# Patient Record
Sex: Female | Born: 1990 | Race: Asian | Hispanic: No | Smoking: Never smoker
Health system: Southern US, Community
[De-identification: ages and names within clinical notes are randomized; demographics above are authoritative.]

## PROBLEM LIST (undated history)

## (undated) ENCOUNTER — Ambulatory Visit: Source: Home / Self Care

## (undated) ENCOUNTER — Inpatient Hospital Stay (HOSPITAL_COMMUNITY): Payer: Self-pay

## (undated) DIAGNOSIS — T7840XA Allergy, unspecified, initial encounter: Secondary | ICD-10-CM

## (undated) DIAGNOSIS — A64 Unspecified sexually transmitted disease: Secondary | ICD-10-CM

## (undated) DIAGNOSIS — R32 Unspecified urinary incontinence: Secondary | ICD-10-CM

## (undated) HISTORY — PX: BREAST BIOPSY: SHX20

## (undated) HISTORY — PX: OTHER SURGICAL HISTORY: SHX169

## (undated) HISTORY — DX: Allergy, unspecified, initial encounter: T78.40XA

## (undated) HISTORY — DX: Unspecified urinary incontinence: R32

## (undated) HISTORY — DX: Unspecified sexually transmitted disease: A64

---

## 2014-01-28 DIAGNOSIS — A64 Unspecified sexually transmitted disease: Secondary | ICD-10-CM

## 2014-01-28 HISTORY — DX: Unspecified sexually transmitted disease: A64

## 2014-02-22 ENCOUNTER — Ambulatory Visit (INDEPENDENT_AMBULATORY_CARE_PROVIDER_SITE_OTHER): Payer: BC Managed Care – PPO | Admitting: Family Medicine

## 2014-02-22 VITALS — BP 110/86 | HR 73 | Temp 98.0°F | Resp 16 | Ht 64.5 in | Wt 167.0 lb

## 2014-02-22 DIAGNOSIS — Z0000 Encounter for general adult medical examination without abnormal findings: Secondary | ICD-10-CM

## 2014-02-22 DIAGNOSIS — Z111 Encounter for screening for respiratory tuberculosis: Secondary | ICD-10-CM

## 2014-02-22 DIAGNOSIS — Z23 Encounter for immunization: Secondary | ICD-10-CM

## 2014-02-22 NOTE — Patient Instructions (Signed)
We will let you know the results of your lipids in a few days.  If further medical problems developed we're happy to see him.

## 2014-02-22 NOTE — Progress Notes (Signed)
Physical Exam Patient is here for physical examination. She is going to be going to [employer] and needs a form completed. She brought in her shot record card with her. She's been very healthy  Past medical history: Operations: None Medical illnesses: None  regular medications: None  allergies: None Gravida 0 para 0  Family history: Parents and 3 siblings are all living and well. Her brother does have high cholesterol.  Social history: Patient works a job and is going to be going to college. She wants to become a Engineer, sitemedical assistant. Has one regular boyfriend. Sexual partner history.  Review of systems: Constitutional: Unremarkable HEENT: Unremarkable Respiratory: Unremarkable Cardiovascular: Unremarkable Genitourinary: Unremarkable Musculoskeletal: Unremarkable Neurologic: Unremarkable Psychiatric: Unremarkable Endocrinologic: Unremarkable  Physical exam: Well-developed well-nourished young lady in no major distress. TMs normal. Eyes PERRLA. Fundi benign. Throat clear. Neck supple without nodes thyromegaly. No carotid bruits. Chest clear to auscultation. Heart regular without murmurs gallops or arrhythmias. Breasts full without any masses. No axillary nodes. Abdomen soft without mass or tenderness. Pelvic normal external genitalia. A little excoriation on her left labia majora. She thinks it's from pantiliners. Pelvic is unremarkable. Cervix benign. Pap was taken. Bimanual exam reveals no uterine or or return masses. Extremities unremarkable. Skin unremarkable.  Assessment: Normal physical exam Mild STD risk  Plan: HIV, RPR, Pap 3, catch up hepatitis A and HPV vaccines, PPD, check lipids

## 2014-02-23 LAB — LIPID PANEL
Cholesterol: 186 mg/dL (ref 0–200)
HDL: 60 mg/dL (ref >39)
LDL Cholesterol: 101 mg/dL — ABNORMAL HIGH (ref 0–99)
Total CHOL/HDL Ratio: 3.1 Ratio
Triglycerides: 124 mg/dL (ref <150)
VLDL: 25 mg/dL (ref 0–40)

## 2014-02-23 LAB — RPR

## 2014-02-23 LAB — HIV ANTIBODY (ROUTINE TESTING W REFLEX): HIV: NONREACTIVE

## 2014-02-24 ENCOUNTER — Ambulatory Visit (INDEPENDENT_AMBULATORY_CARE_PROVIDER_SITE_OTHER): Payer: BC Managed Care – PPO | Admitting: Family Medicine

## 2014-02-24 DIAGNOSIS — Z23 Encounter for immunization: Secondary | ICD-10-CM

## 2014-02-24 DIAGNOSIS — Z111 Encounter for screening for respiratory tuberculosis: Secondary | ICD-10-CM

## 2014-02-24 DIAGNOSIS — Z283 Underimmunization status: Secondary | ICD-10-CM

## 2014-02-24 DIAGNOSIS — A749 Chlamydial infection, unspecified: Secondary | ICD-10-CM

## 2014-02-24 DIAGNOSIS — Z2839 Other underimmunization status: Secondary | ICD-10-CM

## 2014-02-24 LAB — TB SKIN TEST
Induration: 0 mm
TB Skin Test: NEGATIVE

## 2014-02-24 MED ORDER — AZITHROMYCIN 250 MG PO TABS: ORAL_TABLET | ORAL | Status: DC

## 2014-02-24 NOTE — Patient Instructions (Addendum)
Take azithromycin 4 pills together one dose for chlamydia  No sex until your contact has been treated and can prove he has.  Chlamydia Test This a test to see if you have Chlamydia which is a common sexually transmitted disease (STD). You get this disease by having sexual contact (oral, vaginal, or anal) with an infected person. An infected mother can spread the disease to her baby during childbirth. If this test is positive, you have a sexually transmitted disease (STD). DO NOT have unprotected sex until the treatment is complete and you have been retested and are negative. PREPARATION FOR TEST Your caregiver will use a swab to take a sample. The swab may be from the cervix, urethra, penis, anus, or throat. It may be possible to use a urine sample, if the lab where the sample is sent is able to test urine for this disease. NORMAL FINDINGS  No Growth  Antibodies: less than 1:640 Ranges for normal findings may vary among different laboratories and hospitals. You should always check with your doctor after having lab work or other tests done to discuss the meaning of your test results and whether your values are considered within normal limits. MEANING OF TEST  Your caregiver will go over the test results with you and discuss the importance and meaning of your results, as well as treatment options and the need for additional tests if necessary. OBTAINING THE TEST RESULTS It is your responsibility to obtain your test results. Ask the lab or department performing the test when and how you will get your results. Document Released: 12/09/2004 Document Revised: 02/08/2012 Document Reviewed: 10/25/2008 [facility] Patient Information 2014 [place].   Chlamydia, Female Chlamydia is an infection. It is spread through sexual contact. Chlamydia can be in different areas of the body. These areas include the cervix, urethra, throat, or rectum. You may not know you have chlamydia because many people never  develop the symptoms. Chlamydia is not difficult to treat once you know you have it. However, if it is left untreated, chlamydia can lead to more serious health problems.  CAUSES  Chlamydia is caused by bacteria. It is a sexually transmitted disease. It is passed from an infected partner during intimate contact. This contact could be with the genitals, mouth, or rectal area. Chlamydia can also be passed from mothers to babies during birth. SIGNS AND SYMPTOMS  There may not be any symptoms. This is often the case early in the infection. If symptoms develop, they may include:  Mild pain and discomfort when urinating.  Redness, soreness, and swelling (inflammation) of the rectum.  Vaginal discharge.  Painful intercourse.  Abdominal pain.  Bleeding between menstrual periods. DIAGNOSIS  To diagnose this infection, your health care provider will do a pelvic exam. Cultures will be taken of the vagina, cervix, urine, and possibly the rectum to verify the diagnosis.  TREATMENT You will be given antibiotic medicines. If you are pregnant, certain types of antibiotics will need to be avoided. Any sexual partners should also be treated, even if they do not show symptoms.  HOME CARE INSTRUCTIONS   Take your antibiotics as directed. Make sure you finish them even if you start to feel better.  Only take over-the-counter or prescription medicines for pain, discomfort, or fever as directed by your health care provider.  Inform any sexual partners about the infection. They should also be treated.  Do not have sexual contact until your health care provider tells you it is okay.  Get plenty of  rest.  Eat a well-balanced diet.  Drink enough fluids to keep your urine clear or pale yellow.  Follow up with your health care provider as directed. SEEK MEDICAL CARE IF:  You have painful urination.  You have abdominal pain.  You have vaginal discharge.  You have painful sexual intercourse.  You  are a woman and have bleeding between periods and after sex. SEEK IMMEDIATE MEDICAL CARE IF:   You have a fever or persistent symptoms for more than 2 3 days.  You have a fever and your symptoms suddenly get worse.  You experience nausea or vomiting.  You experience excessive sweating (diaphoresis).  You have difficulty swallowing. MAKE SURE YOU:   Understand these instructions.  Will watch your condition.  Will get help right away if you are not doing well or get worse. Document Released: 08/26/2005 Document Revised: 09/06/2013 Document Reviewed: 07/24/2013 [facility] Patient Information 2014 [place], [place].

## 2014-02-24 NOTE — Progress Notes (Signed)
   Subjective:    Patient ID: Misty Kelly, female    DOB: 1991/09/19, 23 y.o.   MRN: [id number]  HPI Pt here for a tb read, tb negative with 0 induration.    Review of Systems     Objective:   Physical Exam        Assessment & Plan:

## 2014-02-24 NOTE — Progress Notes (Signed)
Subjective: Patient was here for her TB skin test and they looked at the reports of her labs and to come back today. The lab tests from the other day came back showing the patient has Chlamydia. She is only when been with her one boyfriend ever in her life. She showed me something else on her form. I had noticed that she needed a meningococcal vaccine still. She would like to go ahead and get it.  Objective: Not examined. Long discussion  Assessment: Chlamydia Immunization deficiency  Plan: Azithromycin 1 g by mouth  This is a little confusing. I had already written her instructions on the PPD visit, so printed those and gave them to her. Please refer to them.

## 2014-02-26 LAB — PAP IG, CT-NG, RFX HPV ASCU
Chlamydia Probe Amp: POSITIVE — AB
GC Probe Amp: NEGATIVE

## 2014-02-27 LAB — HUMAN PAPILLOMAVIRUS, HIGH RISK: HPV DNA High Risk: NOT DETECTED

## 2015-03-09 ENCOUNTER — Ambulatory Visit (INDEPENDENT_AMBULATORY_CARE_PROVIDER_SITE_OTHER): Payer: BLUE CROSS/BLUE SHIELD | Admitting: Physician Assistant

## 2015-03-09 VITALS — BP 128/76 | HR 90 | Temp 97.9°F | Resp 16 | Ht 64.0 in | Wt 176.8 lb

## 2015-03-09 DIAGNOSIS — J309 Allergic rhinitis, unspecified: Secondary | ICD-10-CM

## 2015-03-09 MED ORDER — MONTELUKAST SODIUM 10 MG PO TABS: 10.0000 mg | ORAL_TABLET | Freq: Every day | ORAL | Status: DC

## 2015-03-09 NOTE — Patient Instructions (Signed)
Take claritin daily (take regular claritin at night OR take claritin-D in the morning) Use flonase every morning. Take singulair at night. Drink plenty of water. Call me in 2 weeks if you are not doing any better and I will refer you to an allergist.

## 2015-03-09 NOTE — Progress Notes (Signed)
Subjective:    Patient ID: Misty Kelly, female    DOB: 29-Apr-1991, 24 y.o.   MRN: [id number]  HPI  This is a 24 year old female who is presenting with allergic symptoms x 3 weeks. Symptoms include sneezing, coughing and nasal congestion. Symptoms are worse at night. She states she coughs to "clear an itch in her throat".  Reports last time her allergies were this bad was 3 years ago. Claritin usually works well for her although it has not worked as well this time. She has tried several things over the past few weeks, each for only a few doses and then moving to another OTC medication when the prior med did not seem to be working. She has tried benadryl, claritin, afrin, flonase and mucinex - each without much relief. She denies fever or chills.  Review of Systems  Constitutional: Negative for fever and chills.  HENT: Positive for congestion and sneezing. Negative for ear pain, sinus pressure and sore throat.   Respiratory: Positive for cough. Negative for shortness of breath and wheezing.   Gastrointestinal: Negative for nausea, vomiting and abdominal pain.  Skin: Negative for rash.  Allergic/Immunologic: Positive for environmental allergies.  Hematological: Negative for adenopathy.  Psychiatric/Behavioral: Positive for sleep disturbance (d/t congestion).    There are no active problems to display for this patient.  Prior to Admission medications   Medication Sig Start Date End Date Taking? Authorizing Provider  diphenhydrAMINE (BENADRYL) 25 mg capsule Take 25 mg by mouth every 6 (six) hours as needed.   Yes Historical Provider, MD  fluticasone (FLONASE) 50 MCG/ACT nasal spray Place into both nostrils daily.   Yes Historical Provider, MD  loratadine (CLARITIN) 10 MG tablet Take 10 mg by mouth daily.   Yes Historical Provider, MD  naproxen sodium (ANAPROX) 220 MG tablet Take 220 mg by mouth 2 (two) times daily with a meal.   Yes Historical Provider, MD   No Known  Allergies  Patient's social and family history were reviewed.     Objective:   Physical Exam  Constitutional: She is oriented to person, place, and time. She appears well-developed and well-nourished. No distress.  HENT:  Head: Normocephalic and atraumatic.  Right Ear: Hearing, external ear and ear canal normal. Tympanic membrane is retracted.  Left Ear: Hearing, external ear and ear canal normal. Tympanic membrane is retracted.  Nose: Right sinus exhibits no maxillary sinus tenderness and no frontal sinus tenderness. Left sinus exhibits no maxillary sinus tenderness and no frontal sinus tenderness.  Mouth/Throat: Uvula is midline and mucous membranes are normal. Posterior oropharyngeal erythema present. No oropharyngeal exudate or posterior oropharyngeal edema.  Pale nasal mucosa  Eyes: Conjunctivae and lids are normal. Right eye exhibits no discharge. Left eye exhibits no discharge. No scleral icterus.  Cardiovascular: Normal rate, regular rhythm, normal heart sounds and normal pulses.   No murmur heard. Pulmonary/Chest: Effort normal and breath sounds normal. No respiratory distress. She has no wheezes. She has no rhonchi. She has no rales.  Musculoskeletal: Normal range of motion.  Lymphadenopathy:       Head (right side): No submental, no submandibular and no tonsillar adenopathy present.       Head (left side): No submental, no submandibular and no tonsillar adenopathy present.    She has no cervical adenopathy.  Neurological: She is alert and oriented to person, place, and time.  Skin: Skin is warm, dry and intact. No lesion and no rash noted.  Psychiatric: She has a normal mood  and affect. Her speech is normal and behavior is normal. Thought content normal.   BP 128/76 mmHg  Pulse 90  Temp(Src) 97.9 F (36.6 C) (Oral)  Resp 16  Ht 5\' 4"  (1.626 m)  Wt 176 lb 12.8 oz (80.196 kg)  BMI 30.33 kg/m2  SpO2 99%  LMP 02/11/2015     Assessment & Plan:  1. Allergic rhinitis,  unspecified allergic rhinitis type Pt will consistently take claritin, flonase and singulair daily for next 2 weeks. She will not take any other OTC medication for allergies. She will call me in 2 weeks if her symptoms are not improving and will send her to an allergist for further evaluation.   - montelukast (SINGULAIR) 10 MG tablet; Take 1 tablet (10 mg total) by mouth at bedtime.  Dispense: 30 tablet; Refill: 3   Murial Beam V. Dyke BrackettBush, PA-C, [facility]  03/09/2015

## 2015-05-21 ENCOUNTER — Emergency Department (HOSPITAL_COMMUNITY)
Admission: EM | Admit: 2015-05-21 | Discharge: 2015-05-21 | Disposition: A | Discharge status: Home / Self Care | Payer: BLUE CROSS/BLUE SHIELD | Source: Home / Self Care | Attending: Family Medicine | Admitting: Family Medicine

## 2015-05-21 ENCOUNTER — Emergency Department (INDEPENDENT_AMBULATORY_CARE_PROVIDER_SITE_OTHER): Payer: BLUE CROSS/BLUE SHIELD

## 2015-05-21 ENCOUNTER — Encounter (HOSPITAL_COMMUNITY): Payer: Self-pay | Admitting: Emergency Medicine

## 2015-05-21 DIAGNOSIS — J9801 Acute bronchospasm: Secondary | ICD-10-CM

## 2015-05-21 DIAGNOSIS — R0982 Postnasal drip: Secondary | ICD-10-CM

## 2015-05-21 DIAGNOSIS — J302 Other seasonal allergic rhinitis: Secondary | ICD-10-CM

## 2015-05-21 LAB — POCT URINALYSIS DIP (DEVICE)
Bilirubin Urine: NEGATIVE
Glucose, UA: NEGATIVE mg/dL
Hgb urine dipstick: NEGATIVE
Ketones, ur: NEGATIVE mg/dL
Leukocytes, UA: NEGATIVE
Nitrite: NEGATIVE
Protein, ur: NEGATIVE mg/dL
Specific Gravity, Urine: 1.02 (ref 1.005–1.030)
Urobilinogen, UA: 1 mg/dL (ref 0.0–1.0)
pH: 7.5 (ref 5.0–8.0)

## 2015-05-21 LAB — POCT PREGNANCY, URINE: Preg Test, Ur: NEGATIVE

## 2015-05-21 MED ORDER — PREDNISONE 20 MG PO TABS: ORAL_TABLET | ORAL | Status: DC

## 2015-05-21 MED ORDER — ALBUTEROL SULFATE HFA 108 (90 BASE) MCG/ACT IN AERS: 2.0000 | INHALATION_SPRAY | RESPIRATORY_TRACT | Status: DC | PRN

## 2015-05-21 NOTE — Discharge Instructions (Signed)
Allergic Rhinitis Continue Singulair every night Start Zyrtec or Allegra 180 mg a day Allergic rhinitis is when the mucous membranes in the nose respond to allergens. Allergens are particles in the air that cause your body to have an allergic reaction. This causes you to release allergic antibodies. Through a chain of events, these eventually cause you to release histamine into the blood stream. Although meant to protect the body, it is this release of histamine that causes your discomfort, such as frequent sneezing, congestion, and an itchy, runny nose.  CAUSES  Seasonal allergic rhinitis (hay fever) is caused by pollen allergens that may come from grasses, trees, and weeds. Year-round allergic rhinitis (perennial allergic rhinitis) is caused by allergens such as house dust mites, pet dander, and mold spores.  SYMPTOMS  1. Nasal stuffiness (congestion). 2. Itchy, runny nose with sneezing and tearing of the eyes. DIAGNOSIS  Your health care provider can help you determine the allergen or allergens that trigger your symptoms. If you and your health care provider are unable to determine the allergen, skin or blood testing may be used. TREATMENT  Allergic rhinitis does not have a cure, but it can be controlled by:  Medicines and allergy shots (immunotherapy).  Avoiding the allergen. Hay fever may often be treated with antihistamines in pill or nasal spray forms. Antihistamines block the effects of histamine. There are over-the-counter medicines that may help with nasal congestion and swelling around the eyes. Check with your health care provider before taking or giving this medicine.  If avoiding the allergen or the medicine prescribed do not work, there are many new medicines your health care provider can prescribe. Stronger medicine may be used if initial measures are ineffective. Desensitizing injections can be used if medicine and avoidance does not work. Desensitization is when a patient is given  ongoing shots until the body becomes less sensitive to the allergen. Make sure you follow up with your health care provider if problems continue. HOME CARE INSTRUCTIONS It is not possible to completely avoid allergens, but you can reduce your symptoms by taking steps to limit your exposure to them. It helps to know exactly what you are allergic to so that you can avoid your specific triggers. SEEK MEDICAL CARE IF:   You have a fever.  You develop a cough that does not stop easily (persistent).  You have shortness of breath.  You start wheezing.  Symptoms interfere with normal daily activities. Document Released: 08/11/2001 Document Revised: 11/21/2013 Document Reviewed: 07/24/2013 [facility] Patient Information 2015 [place], [place]. This information is not intended to replace advice given to you by your health care provider. Make sure you discuss any questions you have with your health care provider.  Bronchospasm A bronchospasm is when the tubes that carry air in and out of your lungs (airways) spasm or tighten. During a bronchospasm it is hard to breathe. This is because the airways get smaller. A bronchospasm can be triggered by: 3. Allergies. These may be to animals, pollen, food, or mold. 4. Infection. This is a common cause of bronchospasm. 5. Exercise. 6. Irritants. These include pollution, cigarette smoke, strong odors, aerosol sprays, and paint fumes. 7. Weather changes. 8. Stress. 9. Being emotional. HOME CARE   Always have a plan for getting help. Know when to call your doctor and local emergency services (911 in the [place] Know where you can get emergency care.  Only take medicines as told by your doctor.  If you were prescribed an inhaler or nebulizer machine,  ask your doctor how to use it correctly. Always use a spacer with your inhaler if you were given one.  Stay calm during an attack. Try to relax and breathe more slowly.  Control your home environment:  Change  your heating and air conditioning filter at least once a month.  Limit your use of fireplaces and wood stoves.  Do not  smoke. Do not  allow smoking in your home.  Avoid perfumes and fragrances.  Get rid of pests (such as roaches and mice) and their droppings.  Throw away plants if you see mold on them.  Keep your house clean and dust free.  Replace carpet with wood, tile, or vinyl flooring. Carpet can trap dander and dust.  Use allergy-proof pillows, mattress covers, and box spring covers.  Wash bed sheets and blankets every week in hot water. Dry them in a dryer.  Use blankets that are made of polyester or cotton.  Wash hands frequently. GET HELP IF:  You have muscle aches.  You have chest pain.  The thick spit you spit or cough up (sputum) changes from clear or white to yellow, green, gray, or bloody.  The thick spit you spit or cough up gets thicker.  There are problems that may be related to the medicine you are given such as:  A rash.  Itching.  Swelling.  Trouble breathing. GET HELP RIGHT AWAY IF:  You feel you cannot breathe or catch your breath.  You cannot stop coughing.  Your treatment is not helping you breathe better.  You have very bad chest pain. MAKE SURE YOU:   Understand these instructions.  Will watch your condition.  Will get help right away if you are not doing well or get worse. Document Released: 09/13/2009 Document Revised: 11/21/2013 Document Reviewed: 05/09/2013 [facility] Patient Information 2015 [place], [place]. This information is not intended to replace advice given to you by your health care provider. Make sure you discuss any questions you have with your health care provider.  How to Use an Inhaler Using your inhaler correctly is very important. Good technique will make sure that the medicine reaches your lungs.  HOW TO USE AN INHALER: 10. Take the cap off the inhaler. 11. If this is the first time using your inhaler, you  need to prime it. Shake the inhaler for 5 seconds. Release four puffs into the air, away from your face. Ask your doctor for help if you have questions. 12. Shake the inhaler for 5 seconds. 13. Turn the inhaler so the bottle is above the mouthpiece. 14. Put your pointer finger on top of the bottle. Your thumb holds the bottom of the inhaler. 15. Open your mouth. 16. Either hold the inhaler away from your mouth (the width of 2 fingers) or place your lips tightly around the mouthpiece. Ask your doctor which way to use your inhaler. 17. Breathe out as much air as possible. 18. Breathe in and push down on the bottle 1 time to release the medicine. You will feel the medicine go in your mouth and throat. 19. Continue to take a deep breath in very slowly. Try to fill your lungs. 20. After you have breathed in completely, hold your breath for 10 seconds. This will help the medicine to settle in your lungs. If you cannot hold your breath for 10 seconds, hold it for as long as you can before you breathe out. 21. Breathe out slowly, through pursed lips. Whistling is an example of pursed lips.  22. If your doctor has told you to take more than 1 puff, wait at least 15-30 seconds between puffs. This will help you get the best results from your medicine. Do not use the inhaler more than your doctor tells you to. 23. Put the cap back on the inhaler. 24. Follow the directions from your doctor or from the inhaler package about cleaning the inhaler. If you use more than one inhaler, ask your doctor which inhalers to use and what order to use them in. Ask your doctor to help you figure out when you will need to refill your inhaler.  If you use a steroid inhaler, always rinse your mouth with water after your last puff, gargle and spit out the water. Do not swallow the water. GET HELP IF:  The inhaler medicine only partially helps to stop wheezing or shortness of breath.  You are having trouble using your  inhaler.  You have some increase in thick spit (phlegm). GET HELP RIGHT AWAY IF:  The inhaler medicine does not help your wheezing or shortness of breath or you have tightness in your chest.  You have dizziness, headaches, or fast heart rate.  You have chills, fever, or night sweats.  You have a large increase of thick spit, or your thick spit is bloody. MAKE SURE YOU:   Understand these instructions.  Will watch your condition.  Will get help right away if you are not doing well or get worse. Document Released: 08/25/2008 Document Revised: 09/06/2013 Document Reviewed: 06/15/2013 [facility] Patient Information 2015 [place], [place]. This information is not intended to replace advice given to you by your health care provider. Make sure you discuss any questions you have with your health care provider.

## 2015-05-21 NOTE — ED Provider Notes (Signed)
CSN: [id number]     Arrival date & time 05/21/15  1448 History   First MD Initiated Contact with Patient 05/21/15 1615     Chief Complaint  Patient presents with  . Cough  . Vaginal Discharge   (Consider location/radiation/quality/duration/timing/severity/associated sxs/prior Treatment) HPI Comments: 24 year old female who has had a cough for several years. Tends to become worse during allergy season. She denies allergy symptoms at this time. Denies PND, nasal discharge, red or watery eyes. Denies shortness of breath. The patient had been to another urgent care and prescribed Flonase, Claritin and Singulair. She thought that they did not work very well or she believed that her allergies have been cured at least for times that she had stopped taking her medications.  Her second complaint is a bit more confusing she is complaining of either vaginal discharge or urination when she coughs. She is not sure whether she is urinating when she coughs or she has a vaginal discharge. Either way this is been occurring for about 3 years.  Patient is a 24 y.o. female presenting with cough and vaginal discharge.  Cough Associated symptoms: no ear pain, no fever, no rhinorrhea, no shortness of breath and no sore throat   Vaginal Discharge Associated symptoms: no fever     Past Medical History  Diagnosis Date  . Allergy    History reviewed. No pertinent past surgical history. Family History  Problem Relation Age of Onset  . Hyperlipidemia Brother    History  Substance Use Topics  . Smoking status: Never Smoker   . Smokeless tobacco: Not on file  . Alcohol Use: No   OB History    No data available     Review of Systems  Constitutional: Negative for fever, activity change and fatigue.  HENT: Negative for congestion, ear pain, postnasal drip, rhinorrhea, sneezing and sore throat.   Eyes: Negative.   Respiratory: Positive for cough. Negative for shortness of breath.   Genitourinary:       As  per history of present illness.  Musculoskeletal: Negative.   Skin: Negative.   Neurological: Negative.     Allergies  Review of patient's allergies indicates no known allergies.  Home Medications   Prior to Admission medications   Medication Sig Start Date End Date Taking? Authorizing Provider  albuterol (PROVENTIL HFA;VENTOLIN HFA) 108 (90 BASE) MCG/ACT inhaler Inhale 2 puffs into the lungs every 4 (four) hours as needed for wheezing or shortness of breath. 05/21/15   Hayden Rasmussen, NP  diphenhydrAMINE (BENADRYL) 25 mg capsule Take 25 mg by mouth every 6 (six) hours as needed.    Historical Provider, MD  fluticasone (FLONASE) 50 MCG/ACT nasal spray Place into both nostrils daily.    Historical Provider, MD  loratadine (CLARITIN) 10 MG tablet Take 10 mg by mouth daily.    Historical Provider, MD  montelukast (SINGULAIR) 10 MG tablet Take 1 tablet (10 mg total) by mouth at bedtime. 03/09/15   Dorna Leitz, PA-C  naproxen sodium (ANAPROX) 220 MG tablet Take 220 mg by mouth 2 (two) times daily with a meal.    Historical Provider, MD  predniSONE (DELTASONE) 20 MG tablet Take 3 tabs po on first day, 2 tabs second day, 2 tabs third day, 1 tab fourth day, 1 tab 5th day. Take with food. 05/21/15   Hayden Rasmussen, NP   BP 128/86 mmHg  Pulse 88  Temp(Src) 98.7 F (37.1 C) (Oral)  Resp 12  SpO2 97%  LMP 04/23/2015 Physical Exam  Constitutional: She is oriented to person, place, and time. She appears well-developed and well-nourished. No distress.  HENT:  Bilateral TMs are normal Oropharynx with minor erythema, moderate cobblestoning. No exudates.  Eyes: EOM are normal.  Neck: Normal range of motion. Neck supple.  Cardiovascular: Normal rate, regular rhythm and normal heart sounds.   Pulmonary/Chest: Effort normal and breath sounds normal. No respiratory distress.  Lymphadenopathy:    She has no cervical adenopathy.  Neurological: She is alert and oriented to person, place, and time. She exhibits  normal muscle tone.  Skin: Skin is warm and dry.  Nursing note and vitals reviewed.   ED Course  Procedures (including critical care time) Labs Review Labs Reviewed  POCT URINALYSIS DIP (DEVICE)  POCT PREGNANCY, URINE    Imaging Review Dg Chest 2 View  05/21/2015   CLINICAL DATA:  Cough for 6 months  EXAM: CHEST  2 VIEW  COMPARISON:  None.  FINDINGS: Normal mediastinum and cardiac silhouette. Costophrenic angles are clear. No effusion, infiltrate, or pneumothorax.  IMPRESSION: No acute cardiopulmonary process.  Normal chest radiograph.   Electronically Signed   By: Genevive Bi M.D.   On: 05/21/2015 17:01     MDM   1. PND (post-nasal drip)   2. Cough due to bronchospasm   3. Other seasonal allergic rhinitis    patient notes that she believes that this either vaginal discharge or urine leak is due to her cough. She   declines to have a pelvic examination. For any concerns regarding vaginal or urine problems she prefers to see her gynecologist. Continue Singulair every night Start Zyrtec or Allegra 180 mg a day Albuterol HFA as dir Prednisone taper Obtain PCP for F/U or return if needed    Hayden Rasmussen, NP 05/21/15 1724  Hayden Rasmussen, NP 05/21/15 1725

## 2015-05-21 NOTE — ED Notes (Signed)
Pt states that when she coughs she has a discharge with the cough and states that this issue has been going on for at least 3 years.

## 2015-10-07 ENCOUNTER — Ambulatory Visit (INDEPENDENT_AMBULATORY_CARE_PROVIDER_SITE_OTHER): Payer: BLUE CROSS/BLUE SHIELD | Admitting: Obstetrics and Gynecology

## 2015-10-07 ENCOUNTER — Encounter: Payer: Self-pay | Admitting: Obstetrics and Gynecology

## 2015-10-07 VITALS — BP 130/78 | HR 86 | Resp 16 | Ht 64.5 in | Wt 174.4 lb

## 2015-10-07 DIAGNOSIS — R350 Frequency of micturition: Secondary | ICD-10-CM | POA: Diagnosis not present

## 2015-10-07 DIAGNOSIS — Z113 Encounter for screening for infections with a predominantly sexual mode of transmission: Secondary | ICD-10-CM

## 2015-10-07 DIAGNOSIS — M25559 Pain in unspecified hip: Secondary | ICD-10-CM | POA: Diagnosis not present

## 2015-10-07 LAB — POCT URINALYSIS DIPSTICK
Bilirubin, UA: NEGATIVE
Blood, UA: NEGATIVE
Glucose, UA: NEGATIVE
Ketones, UA: NEGATIVE
Leukocytes, UA: NEGATIVE
Nitrite, UA: NEGATIVE
Protein, UA: NEGATIVE
Urobilinogen, UA: NEGATIVE
pH, UA: 5

## 2015-10-07 LAB — POCT URINE PREGNANCY: Preg Test, Ur: NEGATIVE

## 2015-10-07 LAB — HEPATITIS C ANTIBODY: HCV Ab: NEGATIVE

## 2015-10-07 MED ORDER — IBUPROFEN 800 MG PO TABS: 800.0000 mg | ORAL_TABLET | Freq: Three times a day (TID) | ORAL | Status: DC | PRN

## 2015-10-07 NOTE — Progress Notes (Signed)
Patient ID: Misty Kelly, female   DOB: [birth year], 24 y.o.   MRN: [id number] 24 y.o. G0P0000 Single [place] female here for lower mid pelvic pain off and on for 5 months.  Patient also complains or urinary frequency.  Symptoms began after taking plan B. Feels throbbing inside and a throbbing, excruciating, lasting for 5 - 10 minutes.  Comes and goes.  Feels like the pain is up to a "10." The throbbing pain is different from lower abdominal pain she is also experiencing.  Laying down makes it better.  Movement make both pains worse.  Coughing also hurts. No pain medication use.  Had an episode of pain with intercourse also.  No association to fever, nausea, vomiting, diarrhea, constipation, or voiding.   Menses are regular and "normal." Heavy first 2 days with pad change every 2 - 3 hours.  Not painful.  Has history of chlamydia in 02/22/14, which was tested.  No retesting in record from [facility].  (Patient thought she had this done. )  Has urinary incontinence with coughing or lifting her nephew. Occurring since 2014 or 2015.  Voiding "all the time."  DF every 3 hours.  NF 1 -2 times per night.  No dysuria or hematuria.  No history of stones or UTIs. No prior bladder procedures or pelvic surgery.  Drinks sweet teas - 3 times per day at times. Coffee once per week and sodas once per week.  ETOH with special occasions.   Works a a [employer]vertising account planner.   UPT negative.   PCP:  None   Patient's last menstrual period was 09/19/2015 (exact date).          Sexually active: Yes.  female  The current method of family planning is condoms most of the time.    Exercising: No.   Smoker:  no  Health Maintenance: Pap:  03/2015 normal per patient History of abnormal Pap:  Yes.  02/22/14 had ASCUS and negative HR HPV.  Also had chlamydia at this time. MMG:  n/a Colonoscopy:  n/a BMD:   n/a  Result  n/a TDaP:  Within last 10 years Screening Labs:    Urine today: Neg   reports  that she has never smoked. She does not have any smokeless tobacco history on file. She reports that she drinks alcohol. She reports that she does not use illicit drugs.  Past Medical History  Diagnosis Date  . Allergy   . STD (sexually transmitted disease) 03/2015    Tx'd for Chlamydia--did have neg. test of cure  . Urinary incontinence     History reviewed. No pertinent past surgical history.  Current Outpatient Prescriptions  Medication Sig Dispense Refill  . ibuprofen (ADVIL,MOTRIN) 800 MG tablet Take 800 mg by mouth every 8 (eight) hours as needed.     No current facility-administered medications for this visit.    Family History  Problem Relation Age of Onset  . Hyperlipidemia Brother   . Hypertension Brother   . Cancer Maternal Grandfather     Lung Ca  . Stroke Paternal Grandfather   . Cancer Maternal Grandmother 15     Dec Esophogeal Ca  . Hypertension Mother     ROS:  Pertinent items are noted in HPI.  Otherwise, a comprehensive ROS was negative.  Exam:   BP 130/78 mmHg  Pulse 86  Resp 16  Ht 5' 4.5" (1.638 m)  Wt 174 lb 6.4 oz (79.107 kg)  BMI 29.48 kg/m2  LMP 09/19/2015 (Exact Date)  General appearance: alert, cooperative and appears stated age Head: Normocephalic, without obvious abnormality, atraumatic Neck: no adenopathy, supple, symmetrical, trachea midline and thyroid normal to inspection and palpation Lungs: clear to auscultation bilaterally Heart: regular rate and rhythm Abdomen: soft, mildly tender to palpation of bilateral lower abdomen with some guarding and no rebound.; bowel sounds normal; no masses,  no organomegaly Extremities: extremities normal, atraumatic, no cyanosis or edema Skin: Skin color, texture, turgor normal. No rashes or lesions Lymph nodes: Cervical, supraclavicular, and axillary nodes normal. No abnormal inguinal nodes palpated Neurologic: Grossly normal  Pelvic: External genitalia:  no lesions              Urethra:   normal appearing urethra with no masses, tenderness or lesions              Bartholins and Skenes: normal                 Vagina: normal appearing vagina with normal color and discharge, no lesions              Cervix: no lesions and no CMT.            Bimanual Exam:  Uterus:  normal size, contour, position, consistency, mobility, non-tender              Adnexa:  No mass in either adnexa.  Right adnexal tenderness and no left adnexal tenderness.               Rectovaginal: Yes.  .  Confirms.              Anus:  normal sphincter tone, no lesions  Chaperone was present for exam.  Assessment:     Pelvic pain. Status post tx of chlamydia with no follow up testing.  No acute abdomen. No clear PID. Stress incontinence.  Urinary frequency.   Plan:   Discussion of pelvic pain and possible etiologies - GYN, GI, urinary tract. STD testing performed. Follow up for pelvic ultrasound.  Motrin 800 mg po q 8 hours prn. Discussed reduction of bladder irritants to reduce frequency.  May be good candidate for pelvic floor therapy.  After visit summary provided.   ___30___ minutes face to face time of which over 50% was spent in counseling.

## 2015-10-08 ENCOUNTER — Telehealth: Payer: Self-pay | Admitting: Obstetrics and Gynecology

## 2015-10-08 LAB — WET PREP BY MOLECULAR PROBE
Candida species: NEGATIVE
Gardnerella vaginalis: NEGATIVE
Trichomonas vaginosis: NEGATIVE

## 2015-10-08 LAB — STD PANEL
HIV 1&2 Ab, 4th Generation: NONREACTIVE
Hepatitis B Surface Ag: NEGATIVE

## 2015-10-08 NOTE — Telephone Encounter (Signed)
Called patient to review benefits for procedure. Left voicemail to call back and review. °

## 2015-10-08 NOTE — Telephone Encounter (Signed)
Patient is returning a call to Becky. °

## 2015-10-09 LAB — IPS N GONORRHOEA AND CHLAMYDIA BY PCR

## 2015-10-09 NOTE — Telephone Encounter (Signed)
Returned call to patient to review benefits for procedure. Left voicemail to call back and review.

## 2015-10-10 ENCOUNTER — Ambulatory Visit (INDEPENDENT_AMBULATORY_CARE_PROVIDER_SITE_OTHER): Payer: BLUE CROSS/BLUE SHIELD | Admitting: Obstetrics and Gynecology

## 2015-10-10 ENCOUNTER — Ambulatory Visit (INDEPENDENT_AMBULATORY_CARE_PROVIDER_SITE_OTHER): Payer: BLUE CROSS/BLUE SHIELD

## 2015-10-10 ENCOUNTER — Encounter: Payer: Self-pay | Admitting: Obstetrics and Gynecology

## 2015-10-10 VITALS — BP 108/66 | HR 88 | Ht 64.5 in | Wt 174.0 lb

## 2015-10-10 DIAGNOSIS — N393 Stress incontinence (female) (male): Secondary | ICD-10-CM | POA: Diagnosis not present

## 2015-10-10 DIAGNOSIS — M25559 Pain in unspecified hip: Secondary | ICD-10-CM

## 2015-10-10 MED ORDER — LEVONORGEST-ETH ESTRAD 91-DAY 0.15-0.03 MG PO TABS: 1.0000 | ORAL_TABLET | Freq: Every day | ORAL | Status: DC

## 2015-10-10 NOTE — Progress Notes (Signed)
Subjective  24 y.o. 580P0000 female here for pelvic ultrasound for  Pelvic pain.  Hx chlamydia.  Also has urinary incontinence.   LMP was 09/19/15. Prior LMP ended on 08/17/15. Menses are regular and does not skip cycles.  Uses a lot of Advil.   Has leakage of urine with lifting.  Has chronic cough.  Not using inhalers.  Objective  Pelvic ultrasound images and report reviewed with patient.  Uterus - no masses EMS - 13.93 mm. Ovaries - left CL cyst 15 mm.  Multiple follicles bilaterally.  Free fluid - yes, small amount around left ovary.       STD testing from 10/07/15 - all negative.   Assessment  Pelvic pain.  Unremarkable pelvic ultrasound.  Need for contraception. Stress incontinence.  Chronic cough.   Plan  Discussion of pelvic pain.  Reassured regarding pelvic anatomy.  Discussion of options for contraception that also can treat pelvic pain.  Will treat [place].  Instructed in use.  Discussed potential side effects of thromboembolic events and warning signs. Encourage condom use. Patient will return to care of PCP to get cough under control and get refills of inhalers.  Reduction of cough will help with stress incontinence. Declines referral to PT. Follow up in 3 months.   _15______ minutes face to face time of which over 50% was spent in counseling.   After visit summary to patient.

## 2015-10-10 NOTE — Patient Instructions (Signed)
Ethinyl Estradiol; Levonorgestrel tablets What is this medicine? ETHINYL ESTRADIOL; LEVONORGESTREL (ETH in il es tra DYE ole; LEE voh nor jes trel) is an oral contraceptive. It combines two types of female hormones, an estrogen and a progestin. They are used to prevent ovulation and pregnancy. This medicine may be used for other purposes; ask your health care provider or pharmacist if you have questions. What should I tell my health care provider before I take this medicine? They need to know if you have or ever had any of these conditions: -abnormal vaginal bleeding -blood vessel disease or blood clots -breast, cervical, endometrial, ovarian, liver, or uterine cancer -diabetes -gallbladder disease -heart disease or recent heart attack -high blood pressure -high cholesterol -kidney disease -liver disease -migraine headaches -stroke -systemic lupus erythematosus (SLE) -tobacco smoker -an unusual or allergic reaction to estrogens, progestins, other medicines, foods, dyes, or preservatives -pregnant or trying to get pregnant -breast-feeding How should I use this medicine? Take this medicine by mouth. To reduce nausea, this medicine may be taken with food. Follow the directions on the prescription label. Take this medicine at the same time each day and in the order directed on the package. Do not take your medicine more often than directed. Contact your pediatrician regarding the use of this medicine in children. Special care may be needed. This medicine has been used in female children who have started having menstrual periods. A patient package insert for the product will be given with each prescription and refill. Read this sheet carefully each time. The sheet may change frequently. Overdosage: If you think you have taken too much of this medicine contact a poison control center or emergency room at once. NOTE: This medicine is only for you. Do not share this medicine with others. What if  I miss a dose? If you miss a dose, refer to the patient information sheet you received with your medicine for direction. If you miss more than one pill, this medicine may not be as effective and you may need to use another form of birth control. What may interact with this medicine? -acetaminophen -antibiotics or medicines for infections, especially rifampin, rifabutin, rifapentine, and griseofulvin, and possibly penicillins or tetracyclines -aprepitant -ascorbic acid (vitamin C) -atorvastatin -barbiturate medicines, such as phenobarbital -bosentan -carbamazepine -caffeine -clofibrate -cyclosporine -dantrolene -doxercalciferol -felbamate -grapefruit juice -hydrocortisone -medicines for anxiety or sleeping problems, such as diazepam or temazepam -medicines for diabetes, including pioglitazone -mineral oil -modafinil -mycophenolate -nefazodone -oxcarbazepine -phenytoin -prednisolone -ritonavir or other medicines for HIV infection or AIDS -rosuvastatin -selegiline -soy isoflavones supplements -St. John's wort -tamoxifen or raloxifene -theophylline -thyroid hormones -topiramate -warfarin This list may not describe all possible interactions. Give your health care provider a list of all the medicines, herbs, non-prescription drugs, or dietary supplements you use. Also tell them if you smoke, drink alcohol, or use illegal drugs. Some items may interact with your medicine. What should I watch for while using this medicine? Visit your doctor or health care professional for regular checks on your progress. You will need a regular breast and pelvic exam and Pap smear while on this medicine. Use an additional method of contraception during the first cycle that you take these tablets. If you have any reason to think you are pregnant, stop taking this medicine right away and contact your doctor or health care professional. If you are taking this medicine for hormone related problems, it  may take several cycles of use to see improvement in your condition. Smoking increases the risk of   getting a blood clot or having a stroke while you are taking birth control pills, especially if you are more than 25 years old. You are strongly advised not to smoke. This medicine can make your body retain fluid, making your fingers, hands, or ankles swell. Your blood pressure can go up. Contact your doctor or health care professional if you feel you are retaining fluid. This medicine can make you more sensitive to the sun. Keep out of the sun. If you cannot avoid being in the sun, wear protective clothing and use sunscreen. Do not use sun lamps or tanning beds/booths. If you wear contact lenses and notice visual changes, or if the lenses begin to feel uncomfortable, consult your eye care specialist. In some women, tenderness, swelling, or minor bleeding of the gums may occur. Notify your dentist if this happens. Brushing and flossing your teeth regularly may help limit this. See your dentist regularly and inform your dentist of the medicines you are taking. If you are going to have elective surgery, you may need to stop taking this medicine before the surgery. Consult your health care professional for advice. This medicine does not protect you against HIV infection (AIDS) or any other sexually transmitted diseases. What side effects may I notice from receiving this medicine? Side effects that you should report to your doctor or health care professional as soon as possible: -breast tissue changes or discharge -changes in vaginal bleeding during your period or between your periods -chest pain -coughing up blood -dizziness or fainting spells -headaches or migraines -leg, arm or groin pain -severe or sudden headaches -stomach pain (severe) -sudden shortness of breath -sudden loss of coordination, especially on one side of the body -speech problems -symptoms of vaginal infection like itching,  irritation or unusual discharge -tenderness in the upper abdomen -vomiting -weakness or numbness in the arms or legs, especially on one side of the body -yellowing of the eyes or skin Side effects that usually do not require medical attention (report to your doctor or health care professional if they continue or are bothersome): -breakthrough bleeding and spotting that continues beyond the 3 initial cycles of pills -breast tenderness -mood changes, anxiety, depression, frustration, anger, or emotional outbursts -increased sensitivity to sun or ultraviolet light -nausea -skin rash, acne, or brown spots on the skin -weight gain (slight) This list may not describe all possible side effects. Call your doctor for medical advice about side effects. You may report side effects to FDA at 1-800-FDA-1088. Where should I keep my medicine? Keep out of the reach of children. Store at room temperature between 15 and 30 degrees C (59 and 86 degrees F). Throw away any unused medicine after the expiration date. NOTE: This sheet is a summary. It may not cover all possible information. If you have questions about this medicine, talk to your doctor, pharmacist, or health care provider.    2016, [employer] Standard. (2014-02-26 10:20:56)  

## 2015-12-23 ENCOUNTER — Telehealth: Payer: Self-pay | Admitting: Obstetrics and Gynecology

## 2015-12-23 NOTE — Telephone Encounter (Signed)
Patient called and cancelled her 3 month recheck on 01/11/16 due to "going out of the country." She also said, "I am not sure how long I will be gone but I will call back to reschedule when I return." Patient declined to schedule anything sooner.

## 2016-01-07 NOTE — Telephone Encounter (Signed)
Patient is "out of the country" and not sure how long she may be gone.  Is any further follow up needed at this time or okay to close encounter?

## 2016-01-15 ENCOUNTER — Ambulatory Visit: Payer: BLUE CROSS/BLUE SHIELD | Admitting: Obstetrics and Gynecology

## 2016-03-19 ENCOUNTER — Inpatient Hospital Stay (HOSPITAL_COMMUNITY)
Admission: AD | Admit: 2016-03-19 | Discharge: 2016-03-19 | Disposition: A | Discharge status: Home / Self Care | Payer: Medicaid Other | Source: Ambulatory Visit | Attending: Obstetrics and Gynecology | Admitting: Obstetrics and Gynecology

## 2016-03-19 ENCOUNTER — Inpatient Hospital Stay (HOSPITAL_COMMUNITY): Payer: Medicaid Other

## 2016-03-19 ENCOUNTER — Encounter (HOSPITAL_COMMUNITY): Payer: Self-pay

## 2016-03-19 DIAGNOSIS — Z3A09 9 weeks gestation of pregnancy: Secondary | ICD-10-CM | POA: Diagnosis not present

## 2016-03-19 DIAGNOSIS — O209 Hemorrhage in early pregnancy, unspecified: Secondary | ICD-10-CM | POA: Insufficient documentation

## 2016-03-19 DIAGNOSIS — O26841 Uterine size-date discrepancy, first trimester: Secondary | ICD-10-CM | POA: Insufficient documentation

## 2016-03-19 LAB — CBC
HCT: 37.7 % (ref 36.0–46.0)
Hemoglobin: 12.4 g/dL (ref 12.0–15.0)
MCH: 25.2 pg — ABNORMAL LOW (ref 26.0–34.0)
MCHC: 32.9 g/dL (ref 30.0–36.0)
MCV: 76.6 fL — ABNORMAL LOW (ref 78.0–100.0)
Platelets: 326 10*3/uL (ref 150–400)
RBC: 4.92 MIL/uL (ref 3.87–5.11)
RDW: 13.6 % (ref 11.5–15.5)
WBC: 11.7 10*3/uL — ABNORMAL HIGH (ref 4.0–10.5)

## 2016-03-19 LAB — POCT PREGNANCY, URINE: Preg Test, Ur: POSITIVE — AB

## 2016-03-19 LAB — URINALYSIS, ROUTINE W REFLEX MICROSCOPIC
Bilirubin Urine: NEGATIVE
Glucose, UA: NEGATIVE mg/dL
Ketones, ur: NEGATIVE mg/dL
Leukocytes, UA: NEGATIVE
Nitrite: NEGATIVE
Protein, ur: 100 mg/dL — AB
Specific Gravity, Urine: 1.02 (ref 1.005–1.030)
pH: 7.5 (ref 5.0–8.0)

## 2016-03-19 LAB — WET PREP, GENITAL
Clue Cells Wet Prep HPF POC: NONE SEEN
Sperm: NONE SEEN
Trich, Wet Prep: NONE SEEN
Yeast Wet Prep HPF POC: NONE SEEN

## 2016-03-19 LAB — ABO/RH: ABO/RH(D): B POS

## 2016-03-19 LAB — URINE MICROSCOPIC-ADD ON
Bacteria, UA: NONE SEEN
WBC, UA: NONE SEEN WBC/hpf (ref 0–5)

## 2016-03-19 LAB — HCG, QUANTITATIVE, PREGNANCY: hCG, Beta Chain, Quant, S: 5359 m[IU]/mL — ABNORMAL HIGH (ref <5)

## 2016-03-19 NOTE — Discharge Instructions (Signed)

## 2016-03-19 NOTE — MAU Provider Note (Signed)
Chief Complaint: Vaginal Bleeding   First Provider Initiated Contact with Patient 03/19/16 1651     SUBJECTIVE HPI: Misty Kelly is a 25 y.o. G1P0000 at approximately 9 weeks based on date of conception who presents to Maternity Admissions reporting spotting 4 days and bleeding like the beginning. 1 hour. Positive pregnancy test at [facility]. No other testing this pregnancy.  Location: Suprapubic Quality: Cramping Severity: Mild Duration: Few hours Course: Worsening Timing: Intermittent Modifying factors: Nothing. Hasn't tried anything for the pain. Associated signs and symptoms: . Positive for vaginal bleeding. Negative for fever, chills, vaginal discharge, urinary complaints or GI complaints.  Past Medical History  Diagnosis Date  . Allergy   . Urinary incontinence   . STD (sexually transmitted disease) 01/2014    Tx'd for Chlamydia--did have neg. test of cure   OB History  Gravida Para Term Preterm AB SAB TAB Ectopic Multiple Living     # Outcome Date GA Lbr Len/2nd Weight Sex Delivery Anes PTL Lv  1 Current              Past Surgical History  Procedure Laterality Date  . No past surgeries     Social History   Social History  . Marital Status: Single    Spouse Name: N/A  . Number of Children: N/A  . Years of Education: N/A   Occupational History  . Not on file.   Social History Main Topics  . Smoking status: Never Smoker   . Smokeless tobacco: Never Used  . Alcohol Use: 0.0 oz/week    0 Standard drinks or equivalent per week     Comment: only special occasions  . Drug Use: No  . Sexual Activity:    Partners: Male    Birth Control/ Protection: Condom     Comment: condoms everytime   Other Topics Concern  . Not on file   Social History Narrative   No current facility-administered medications on file prior to encounter.   Current Outpatient Prescriptions on File Prior to Encounter  Medication Sig Dispense Refill  .  ibuprofen (ADVIL,MOTRIN) 800 MG tablet Take 1 tablet (800 mg total) by mouth every 8 (eight) hours as needed. (Patient not taking: Reported on 03/19/2016) 30 tablet 0  . levonorgestrel-ethinyl estradiol (SEASONALE,INTROVALE,JOLESSA) 0.15-0.03 MG tablet Take 1 tablet by mouth daily. (Patient not taking: Reported on 03/19/2016) 1 Package 1   No Known Allergies  I have reviewed the past Medical Hx, Surgical Hx, Social Hx, Allergies and Medications.   Review of Systems  Constitutional: Negative for fever and chills.  Gastrointestinal: Positive for abdominal pain. Negative for nausea, vomiting, diarrhea and constipation.  Genitourinary: Positive for vaginal bleeding. Negative for dysuria, hematuria and vaginal discharge.  Musculoskeletal: Negative for back pain.  Neurological: Negative for dizziness.    OBJECTIVE Patient Vitals for the past 24 hrs:  BP Temp Temp src Pulse Resp Height Weight  03/19/16 1600 119/71 mmHg 98.1 F (36.7 C) Oral 80 18 5' 4.5" (1.638 m) 172 lb 9.6 oz (78.291 kg)   Constitutional: Well-developed, well-nourished female in no acute distress.  Cardiovascular: normal rate Respiratory: normal rate and effort.  GI: Abd soft, non-tender. Pos BS x 4 MS: Extremities nontender, no edema, normal ROM Neurologic: Alert and oriented x 4.  GU: Neg CVAT.  SPECULUM EXAM: NEFG, small amount of red blood noted, cervix clean  BIMANUAL: cervix closed; uterus top-normal size, no adnexal tenderness or masses. No CMT.  LAB RESULTS Results for orders placed or performed during the hospital encounter of 03/19/16 (from the past 24 hour(s))  Urinalysis, Routine w reflex microscopic (not at [facility])     Status: Abnormal   Collection Time: 03/19/16  4:19 PM  Result Value Ref Range   Color, Urine RED (A) YELLOW   APPearance CLOUDY (A) CLEAR   Specific Gravity, Urine 1.020 1.005 - 1.030   pH 7.5 5.0 - 8.0   Glucose, UA NEGATIVE NEGATIVE mg/dL   Hgb urine dipstick LARGE (A) NEGATIVE    Bilirubin Urine NEGATIVE NEGATIVE   Ketones, ur NEGATIVE NEGATIVE mg/dL   Protein, ur 161100 (A) NEGATIVE mg/dL   Nitrite NEGATIVE NEGATIVE   Leukocytes, UA NEGATIVE NEGATIVE  Urine microscopic-add on     Status: Abnormal   Collection Time: 03/19/16  4:19 PM  Result Value Ref Range   Squamous Epithelial / LPF 0-5 (A) NONE SEEN   WBC, UA NONE SEEN 0 - 5 WBC/hpf   RBC / HPF TOO NUMEROUS TO COUNT 0 - 5 RBC/hpf   Bacteria, UA NONE SEEN NONE SEEN  Pregnancy, urine POC     Status: Abnormal   Collection Time: 03/19/16  4:22 PM  Result Value Ref Range   Preg Test, Ur POSITIVE (A) NEGATIVE  Wet prep, genital     Status: Abnormal   Collection Time: 03/19/16  4:55 PM  Result Value Ref Range   Yeast Wet Prep HPF POC NONE SEEN NONE SEEN   Trich, Wet Prep NONE SEEN NONE SEEN   Clue Cells Wet Prep HPF POC NONE SEEN NONE SEEN   WBC, Wet Prep HPF POC FEW (A) NONE SEEN   Sperm NONE SEEN   CBC     Status: Abnormal   Collection Time: 03/19/16  5:15 PM  Result Value Ref Range   WBC 11.7 (H) 4.0 - 10.5 K/uL   RBC 4.92 3.87 - 5.11 MIL/uL   Hemoglobin 12.4 12.0 - 15.0 g/dL   HCT 09.637.7 04.536.0 - 40.946.0 %   MCV 76.6 (L) 78.0 - 100.0 fL   MCH 25.2 (L) 26.0 - 34.0 pg   MCHC 32.9 30.0 - 36.0 g/dL   RDW 81.113.6 91.411.5 - 78.215.5 %   Platelets 326 150 - 400 K/uL  ABO/Rh     Status: None (Preliminary result)   Collection Time: 03/19/16  5:15 PM  Result Value Ref Range   ABO/RH(D) B POS     IMAGING [place] Ob Comp Less 14 Wks  03/19/2016  CLINICAL DATA:  Vaginal bleeding in first trimester pregnancy, spotting for 4 days increased 1 hour ago, no pain ; quantitative beta HCG not available for correlation at this time EXAM: OBSTETRIC <14 WK US AND TRANSVAGINAL OB US TECHNIQUE: Both transabdominal and transvaginal ultrasound examinations were performed for complete evaluation of the gestation as well as the maternal uterus, adnexal regions, and pelvic cul-de-sac. Transvaginal technique was performed to assess early pregnancy.  COMPARISON:  None FINDINGS: Intrauterine gestational sac: Present but irregular in shape Yolk sac:  Not well visualize Embryo:  Present Cardiac Activity: Absent Heart Rate: N/A  bpm CRL:  3.8  mm   6 w   1 d                  US EDC: 11/09/2016 Subchorionic hemorrhage:  Nonvisualized Maternal uterus/adnexae: Unremarkable ovaries. No free pelvic fluid or adnexal masses. IMPRESSION: Gestational sac present within the uterus though the sac appears irregular in contour. Small fetal pole is seen without  fetal cardiac activity detected. Findings are suspicious but not yet definitive for failed pregnancy. Recommend follow-up US in 10-14 days for definitive diagnosis. This recommendation follows SRU consensus guidelines: Diagnostic Criteria for Nonviable Pregnancy Early in the First Trimester. Malva Limes Med 2013; 829:5621-30. Electronically Signed   By: Ulyses Southward M.D.   On: 03/19/2016 17:51   US Ob Transvaginal  03/19/2016  CLINICAL DATA:  Vaginal bleeding in first trimester pregnancy, spotting for 4 days increased 1 hour ago, no pain ; quantitative beta HCG not available for correlation at this time EXAM: OBSTETRIC <14 WK [place] AND TRANSVAGINAL OB US TECHNIQUE: Both transabdominal and transvaginal ultrasound examinations were performed for complete evaluation of the gestation as well as the maternal uterus, adnexal regions, and pelvic cul-de-sac. Transvaginal technique was performed to assess early pregnancy. COMPARISON:  None FINDINGS: Intrauterine gestational sac: Present but irregular in shape Yolk sac:  Not well visualize Embryo:  Present Cardiac Activity: Absent Heart Rate: N/A  bpm CRL:  3.8  mm   6 w   1 d                  [place] EDC: 11/09/2016 Subchorionic hemorrhage:  Nonvisualized Maternal uterus/adnexae: Unremarkable ovaries. No free pelvic fluid or adnexal masses. IMPRESSION: Gestational sac present within the uterus though the sac appears irregular in contour. Small fetal pole is seen without fetal cardiac  activity detected. Findings are suspicious but not yet definitive for failed pregnancy. Recommend follow-up US in 10-14 days for definitive diagnosis. This recommendation follows SRU consensus guidelines: Diagnostic Criteria for Nonviable Pregnancy Early in the First Trimester. Malva Limes Med 2013; 865:7846-96. Electronically Signed   By: Ulyses Southward M.D.   On: 03/19/2016 17:51    MAU COURSE CBC, Quant, ABO/Rh, ultrasound, wet prep and GC/chlamydia culture, UA  MDM Pain and bleeding in early pregnancy with intrauterine pregnancy, but fetus measuring size less than dates-- suspicious for failed pregnancy. Hemodynamically stable.  ASSESSMENT 1. Vaginal bleeding in pregnancy, first trimester   2. Uterine size date discrepancy pregnancy, first trimester     PLAN Discharge home in stable condition. SAB precautions   Follow-up Information    Follow up with [facility] On 03/26/2016.   Specialty:  Obstetrics and Gynecology   Why:  repeat ultrasound   Contact information:   [address] [place] [address] [phone]      Follow up with [facility] [place] [facility]ERN[facility] ADMISS[facility].   Why:  As needed if symptoms worsen   Contact information:   [address] [id number] mc [place] [address] [phone]        Medication List    STOP taking these medications        ibuprofen 800 MG tablet  Commonly known as:  ADVIL,MOTRIN     levonorgestrel-ethinyl estradiol 0.15-0.03 MG tablet  Commonly known as:  SEASONALE,INTROVALE,JOLESSA     prenatal multivitamin Tabs tablet       Dorathy Kinsman, CNM 03/19/2016  6:05 PM  4

## 2016-03-19 NOTE — MAU Note (Signed)
Pt started spotting four days ago, started bleeding like a period an hour ago.  Pos HPT & pos UPT @ [facility].  Denies pain.

## 2016-03-20 LAB — GC/CHLAMYDIA PROBE AMP (~~LOC~~) NOT AT ARMC
Chlamydia: NEGATIVE
Neisseria Gonorrhea: NEGATIVE

## 2016-03-20 LAB — HIV ANTIBODY (ROUTINE TESTING W REFLEX): HIV Screen 4th Generation wRfx: NONREACTIVE

## 2016-03-21 ENCOUNTER — Inpatient Hospital Stay (HOSPITAL_COMMUNITY)
Admission: AD | Admit: 2016-03-21 | Discharge: 2016-03-21 | Disposition: A | Discharge status: Home / Self Care | Payer: Medicaid Other | Source: Ambulatory Visit | Attending: Family Medicine | Admitting: Family Medicine

## 2016-03-21 ENCOUNTER — Encounter (HOSPITAL_COMMUNITY): Payer: Self-pay | Admitting: *Deleted

## 2016-03-21 DIAGNOSIS — Z3A01 Less than 8 weeks gestation of pregnancy: Secondary | ICD-10-CM | POA: Diagnosis not present

## 2016-03-21 DIAGNOSIS — O039 Complete or unspecified spontaneous abortion without complication: Secondary | ICD-10-CM | POA: Diagnosis not present

## 2016-03-21 DIAGNOSIS — R109 Unspecified abdominal pain: Secondary | ICD-10-CM | POA: Insufficient documentation

## 2016-03-21 LAB — HCG, QUANTITATIVE, PREGNANCY: hCG, Beta Chain, Quant, S: 3145 m[IU]/mL — ABNORMAL HIGH (ref <5)

## 2016-03-21 MED ORDER — IBUPROFEN 600 MG PO TABS: 600.0000 mg | ORAL_TABLET | Freq: Four times a day (QID) | ORAL | Status: DC | PRN

## 2016-03-21 MED ORDER — OXYCODONE-ACETAMINOPHEN 5-325 MG PO TABS: 1.0000 | ORAL_TABLET | Freq: Four times a day (QID) | ORAL | Status: DC | PRN

## 2016-03-21 NOTE — MAU Provider Note (Signed)
History     CSN: [id number] Arrival date and time: 03/21/16 0454 First Provider Initiated Contact with Patient 03/21/16 [phone]      Chief Complaint  Patient presents with  . Abdominal Cramping  . Vaginal Bleeding   HPI Patient is 25 y.o. G1P0000 [redacted]w[redacted]d here with complaints of vaginal bleeding with recent MAU visit for bleeding and found to have an abnormal gestational sac with no fetal heart activity at [redacted]w[redacted]d. She reports she has continued to have bleeding and new onset of painful cramping. She used 4 pads since midnight. She reports passing a clot the size of her hand and after this the cramping improved. Denies dizziness, SOB.   OB History    Gravida Para Term Preterm AB TAB SAB Ectopic Multiple Living        Past Medical History  Diagnosis Date  . Allergy   . Urinary incontinence   . STD (sexually transmitted disease) 01/2014    Tx'd for Chlamydia--did have neg. test of cure    Past Surgical History  Procedure Laterality Date  . No past surgeries      Family History  Problem Relation Age of Onset  . Hyperlipidemia Brother   . Hypertension Brother   . Cancer Maternal Grandfather     Lung Ca  . Stroke Paternal Grandfather   . Cancer Maternal Grandmother 67     Dec Esophogeal Ca  . Hypertension Mother     Social History  Substance Use Topics  . Smoking status: Never Smoker   . Smokeless tobacco: Never Used  . Alcohol Use: 0.0 oz/week    0 Standard drinks or equivalent per week     Comment: only special occasions    Allergies: No Known Allergies  Prescriptions prior to admission  Medication Sig Dispense Refill Last Dose  . Prenatal Vit-Fe Fumarate-FA (PRENATAL MULTIVITAMIN) TABS tablet Take 1 tablet by mouth daily at 12 noon.   03/19/2016 at Unknown time    Review of Systems  Constitutional: Negative for fever and chills.  Eyes: Negative for blurred vision and double vision.  Respiratory: Negative for cough and shortness of breath.    Cardiovascular: Negative for chest pain and orthopnea.  Gastrointestinal: Negative for nausea and vomiting.  Genitourinary: Negative for dysuria, frequency and flank pain.  Musculoskeletal: Negative for myalgias.  Skin: Negative for rash.  Neurological: Negative for dizziness, tingling, weakness and headaches.  Endo/Heme/Allergies: Does not bruise/bleed easily.  Psychiatric/Behavioral: Negative for depression and suicidal ideas. The patient is not nervous/anxious.    Physical Exam   Blood pressure 133/91, pulse 94, temperature 98.3 F (36.8 C), resp. rate 18, height 5' 4.5" (1.638 m), weight 174 lb 6.4 oz (79.107 kg), last menstrual period 11/14/2015.  Physical Exam  Nursing note and vitals reviewed. Constitutional: She is oriented to person, place, and time. She appears well-developed and well-nourished. No distress.  HENT:  Head: Normocephalic and atraumatic.  Eyes: Conjunctivae are normal. No scleral icterus.  Neck: Normal range of motion. Neck supple.  Cardiovascular: Normal rate and intact distal pulses.   Respiratory: Effort normal. She exhibits no tenderness.  GI: Soft. There is no tenderness. There is no rebound and no guarding.  Genitourinary: Vagina normal.  Copious blood in vaginal vault. Cervical os is open. No fetal tissue present.   Musculoskeletal: Normal range of motion. She exhibits no edema.  Neurological: She is alert and oriented to person, place, and time.  Skin: Skin is warm  and dry. No rash noted.  Psychiatric: She has a normal mood and affect.    MAU Course  Procedures  MDM-  Most likely SAB   Results for orders placed or performed during the hospital encounter of 03/21/16 (from the past 72 hour(s))  hCG, quantitative, pregnancy     Status: Abnormal   Collection Time: 03/21/16  5:26 AM  Result Value Ref Range   hCG, Beta Chain, Quant, S 3145 (H) <5 mIU/mL    Comment:          GEST. AGE      CONC.  (mIU/mL)   <=1 WEEK        5 - 50     2 WEEKS        50 - 500     3 WEEKS       100 - 10,000     4 WEEKS     1,000 - 30,000     5 WEEKS     3,500 - 115,000   6-8 WEEKS     12,000 - 270,000    12 WEEKS     15,000 - 220,000        FEMALE AND NON-PREGNANT FEMALE:     LESS THAN 5 mIU/mL     US Ob Comp Less 14 Wks  03/19/2016  CLINICAL DATA:  Vaginal bleeding in first trimester pregnancy, spotting for 4 days increased 1 hour ago, no pain ; quantitative beta HCG not available for correlation at this time EXAM: OBSTETRIC <14 WK US AND TRANSVAGINAL OB US TECHNIQUE: Both transabdominal and transvaginal ultrasound examinations were performed for complete evaluation of the gestation as well as the maternal uterus, adnexal regions, and pelvic cul-de-sac. Transvaginal technique was performed to assess early pregnancy. COMPARISON:  None FINDINGS: Intrauterine gestational sac: Present but irregular in shape Yolk sac:  Not well visualize Embryo:  Present Cardiac Activity: Absent Heart Rate: N/A  bpm CRL:  3.8  mm   6 w   1 d                  US EDC: 11/09/2016 Subchorionic hemorrhage:  Nonvisualized Maternal uterus/adnexae: Unremarkable ovaries. No free pelvic fluid or adnexal masses.  IMPRESSION: Gestational sac present within the uterus though the sac appears irregular in contour. Small fetal pole is seen without fetal cardiac activity detected. Findings are suspicious but not yet definitive for failed pregnancy. Recommend follow-up US in 10-14 days for definitive diagnosis. This recommendation follows SRU consensus guidelines: Diagnostic Criteria for Nonviable Pregnancy Early in the First Trimester. Malva Limes Engl J Med 2013; 161:0960-45; 369:1443-51. Electronically Signed   By: Ulyses SouthwardMark  Boles M.D.   On: 03/19/2016 17:51    Lab Results  Component Value Date   HCGBETAQNT 3145* 03/21/2016   HCGBETAQNT 5359* 03/19/2016    Assessment and Plan   #Miscarriage: patient had an US on 4/20 concerning for a failed pregnancy and bHCG today is falling and likely passed the POC at home prior  to arrival.  - no need for rhogam as patient is B POS - Hgb on 4/20 was wnl, no need to recheck today.  - Rx for ibuprofen and percocet#5 - Discussed return precautions - follow up appointment in 2 weeks for miscarriage follow up.  - Provided counseling and support.   Isa RankinKimberly Niles Alvester MorinNewton 03/21/2016, 7:22 AM   Attending Physician: Candelaria CelesteJacob Stinson MD

## 2016-03-21 NOTE — MAU Note (Signed)
Having abd cramping and passed some blood clots earlier tonight about 2200. Went to sleep and awoke this am with more cramping and clots. Last clot size of palm.

## 2016-03-21 NOTE — Discharge Instructions (Signed)
Miscarriage  A miscarriage is the sudden loss of an unborn baby (fetus) before the 20th week of pregnancy. Most miscarriages happen in the first 3 months of pregnancy. Sometimes, it happens before a woman even knows she is pregnant. A miscarriage is also called a "spontaneous miscarriage" or "early pregnancy loss." Having a miscarriage can be an emotional experience. Talk with your caregiver about any questions you may have about miscarrying, the grieving process, and your future pregnancy plans.  CAUSES    Problems with the fetal chromosomes that make it impossible for the baby to develop normally. Problems with the baby's genes or chromosomes are most often the result of errors that occur, by chance, as the embryo divides and grows. The problems are not inherited from the parents.   Infection of the cervix or uterus.    Hormone problems.    Problems with the cervix, such as having an incompetent cervix. This is when the tissue in the cervix is not strong enough to hold the pregnancy.    Problems with the uterus, such as an abnormally shaped uterus, uterine fibroids, or congenital abnormalities.    Certain medical conditions.    Smoking, drinking alcohol, or taking illegal drugs.    Trauma.   Often, the cause of a miscarriage is unknown.   SYMPTOMS    Vaginal bleeding or spotting, with or without cramps or pain.   Pain or cramping in the abdomen or lower back.   Passing fluid, tissue, or blood clots from the vagina.  DIAGNOSIS   Your caregiver will perform a physical exam. You may also have an ultrasound to confirm the miscarriage. Blood or urine tests may also be ordered.  TREATMENT    Sometimes, treatment is not necessary if you naturally pass all the fetal tissue that was in the uterus. If some of the fetus or placenta remains in the body (incomplete miscarriage), tissue left behind may become infected and must be removed. Usually, a dilation and curettage (D and C) procedure is performed.  During a D and C procedure, the cervix is widened (dilated) and any remaining fetal or placental tissue is gently removed from the uterus.   Antibiotic medicines are prescribed if there is an infection. Other medicines may be given to reduce the size of the uterus (contract) if there is a lot of bleeding.   If you have Rh negative blood and your baby was Rh positive, you will need a Rh immunoglobulin shot. This shot will protect any future baby from having Rh blood problems in future pregnancies.  HOME CARE INSTRUCTIONS    Your caregiver may order bed rest or may allow you to continue light activity. Resume activity as directed by your caregiver.   Have someone help with home and family responsibilities during this time.    Keep track of the number of sanitary pads you use each day and how soaked (saturated) they are. Write down this information.    Do not use tampons. Do not douche or have sexual intercourse until approved by your caregiver.    Only take over-the-counter or prescription medicines for pain or discomfort as directed by your caregiver.    Do not take aspirin. Aspirin can cause bleeding.    Keep all follow-up appointments with your caregiver.    If you or your partner have problems with grieving, talk to your caregiver or seek counseling to help cope with the pregnancy loss. Allow enough time to grieve before trying to get pregnant again.     SEEK IMMEDIATE MEDICAL CARE IF:    You have severe cramps or pain in your back or abdomen.   You have a fever.   You pass large blood clots (walnut-sized or larger) ortissue from your vagina. Save any tissue for your caregiver to inspect.    Your bleeding increases.    You have a thick, bad-smelling vaginal discharge.   You become lightheaded, weak, or you faint.    You have chills.   MAKE SURE YOU:   Understand these instructions.   Will watch your condition.   Will get help right away if you are not doing well or get worse.     This  information is not intended to replace advice given to you by your health care provider. Make sure you discuss any questions you have with your health care provider.     Document Released: 05/12/2001 Document Revised: 03/13/2013 Document Reviewed: 01/05/2012  Elsevier Interactive Patient Education 2016 Elsevier Inc.

## 2016-03-21 NOTE — Progress Notes (Signed)
Dr Newton in earlier to discuss test results and d/c plan. Written and verbal d/c instructions given and understanding voiced 

## 2016-04-22 ENCOUNTER — Encounter: Payer: Self-pay | Admitting: Obstetrics & Gynecology

## 2016-04-22 ENCOUNTER — Ambulatory Visit (INDEPENDENT_AMBULATORY_CARE_PROVIDER_SITE_OTHER): Payer: Medicaid Other | Admitting: Obstetrics & Gynecology

## 2016-04-22 VITALS — BP 108/51 | HR 81 | Wt 178.1 lb

## 2016-04-22 DIAGNOSIS — O039 Complete or unspecified spontaneous abortion without complication: Secondary | ICD-10-CM

## 2016-04-22 NOTE — Progress Notes (Signed)
Patient ID: Misty Kelly, female   DOB: 14-Mar-1991, 25 y.o.   MRN: [id number] History:  25 y.o. G1P0000 here today for f/u after SAB. Pt reports that she had only ~4 days of bleeding after SAB.Marland Kitchen. She denies pain.  She feels well emotionally.   The following portions of the patient's history were reviewed and updated as appropriate: allergies, current medications, past family history, past medical history, past social history, past surgical history and problem list.  Review of Systems:  Pertinent items are noted in HPI.  Objective:  Physical Exam Blood pressure 108/51, pulse 81, weight 178 lb 1.6 oz (80.786 kg), last menstrual period 11/14/2015, unknown if currently breastfeeding. Gen: NAD Abd: Soft, nontender and nondistended Pelvic: deferred  Labs and Imaging Component     Latest Ref Rng 03/19/2016 03/21/2016  HCG, Beta Chain, Quant, S     <5 mIU/mL 5359 (H) 3145 (H)    Assessment & Plan:  Pt s/p SAB- doing  Well  Quant HCG No contraception desired at present F/u prn  Misty Kelly, M.D., Evern CoreFACOG

## 2016-04-23 LAB — HCG, QUANTITATIVE, PREGNANCY: hCG, Beta Chain, Quant, S: <2 m[IU]/mL

## 2016-04-29 ENCOUNTER — Telehealth: Payer: Self-pay | Admitting: General Practice

## 2016-04-29 NOTE — Telephone Encounter (Signed)
Called patient & informed her of bhcg results. Patient verbalized understanding & asked if a d/c was necessary after every miscarriage. Told patient no only if the body was not able to fully expel all of the pregnancy which might be an indication if the pregnancy hormone levels weren't going down appropriately. Patient verbalized understanding & had no questions

## 2017-06-13 IMAGING — US US OB COMP LESS 14 WK
1 series · 15 of 28 positions shown · non-contrast
Comparison: None

CLINICAL DATA: Vaginal bleeding in first trimester pregnancy,
spotting for 4 days increased 1 hour ago, no pain ; quantitative
beta HCG not available for correlation at this time

EXAM:
OBSTETRIC <14 WK US AND TRANSVAGINAL OB US
TECHNIQUE: Both transabdominal and transvaginal ultrasound examinations were
performed for complete evaluation of the gestation as well as the
maternal uterus, adnexal regions, and pelvic cul-de-sac.
Transvaginal technique was performed to assess early pregnancy.

[Series 1: us ob comp less 14 wk · 15 of 37 slices shown]
[im 1/37]
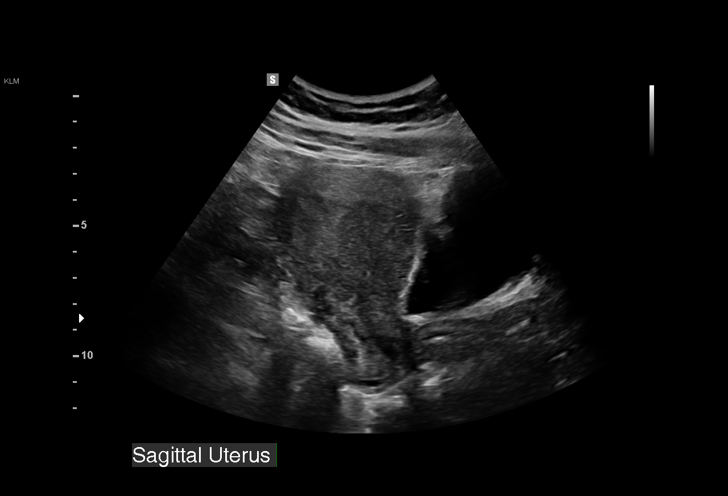
[im 3/37]
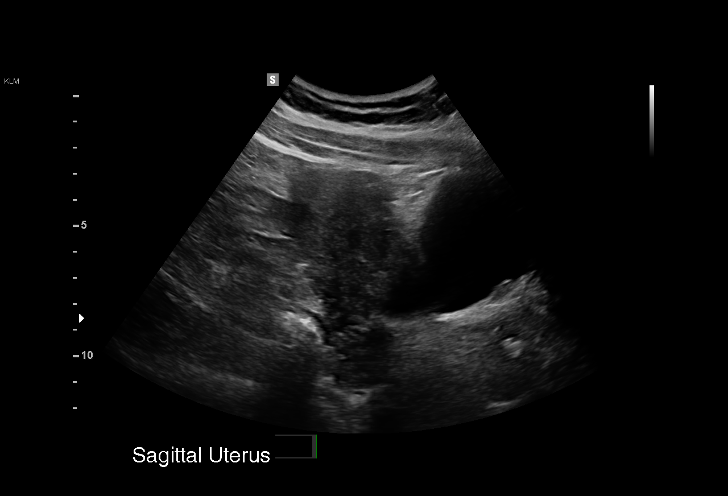
[im 6/37]
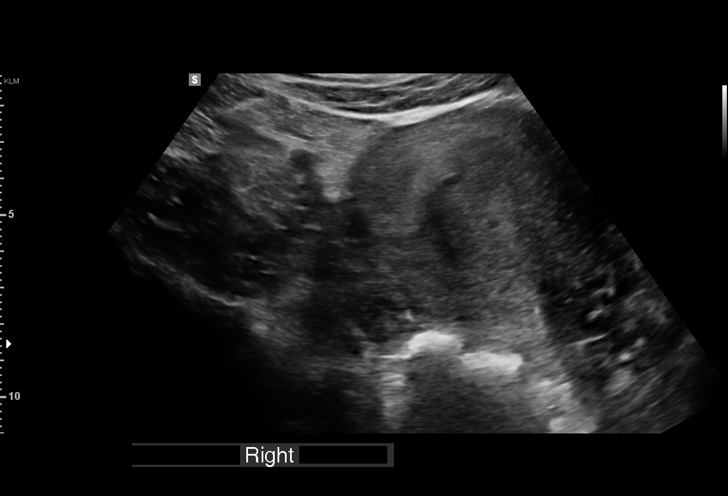
[im 9/37]
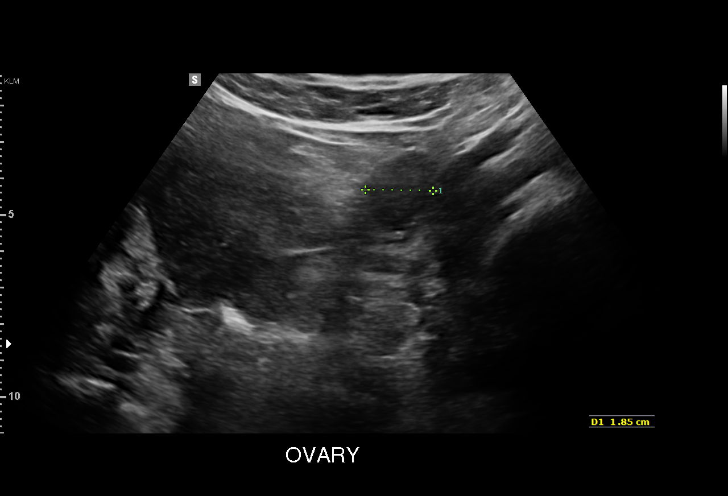
[im 11/37]
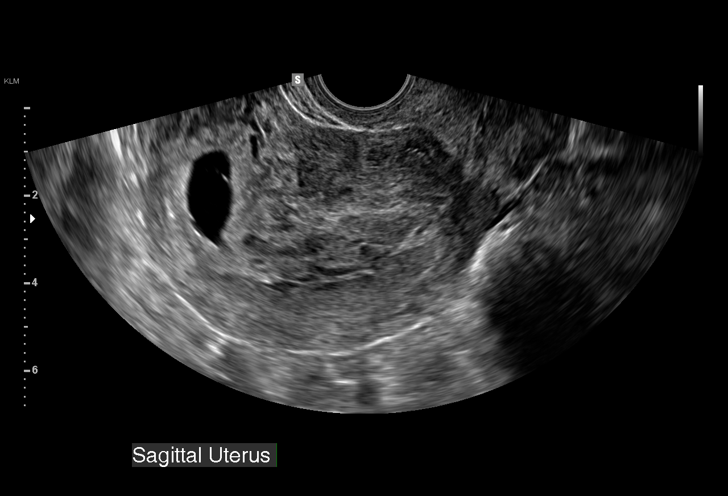
[im 14/37]
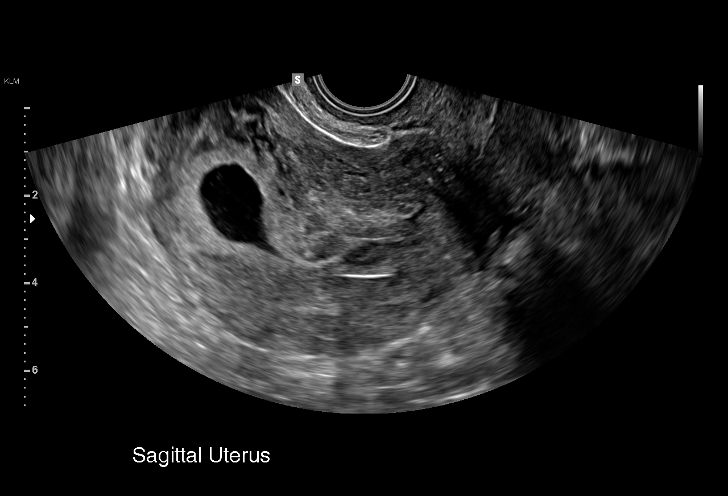
[im 17/37]
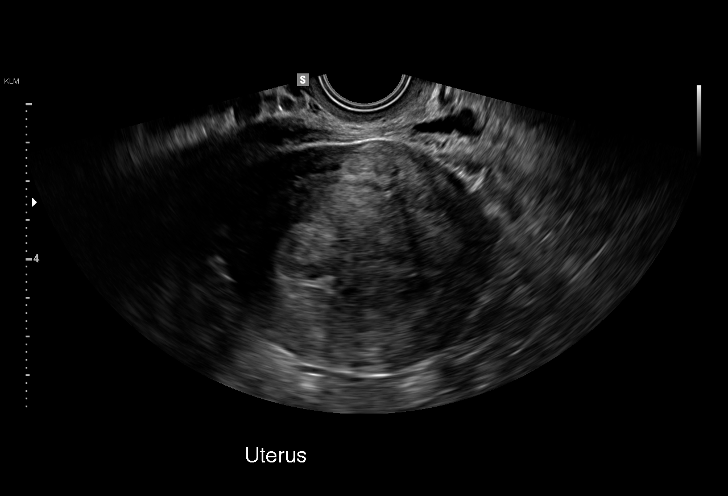
[im 19/37]
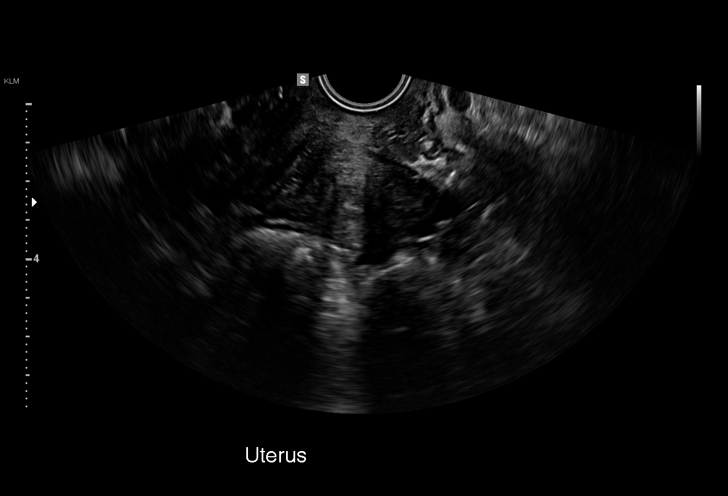
[im 21/37]
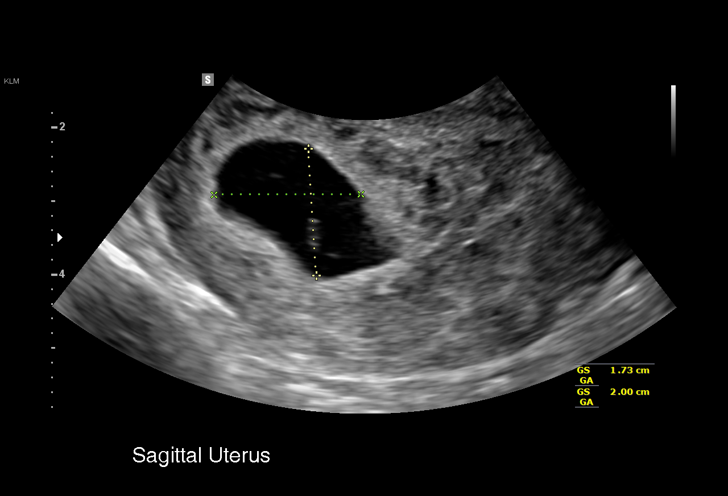
[im 23/37]
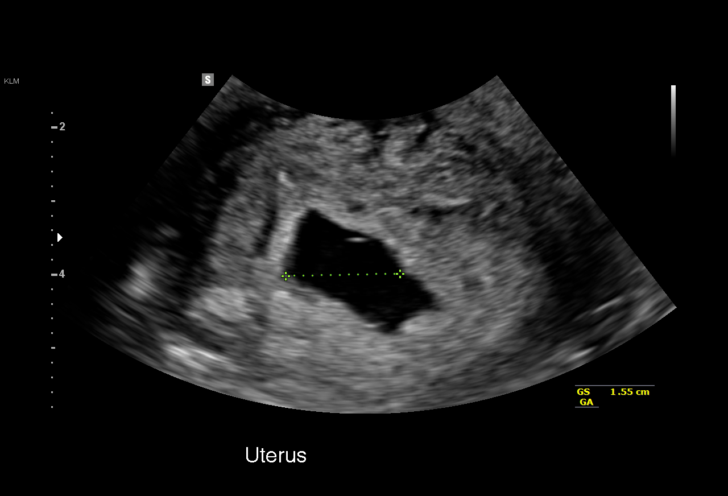
[im 26/37]
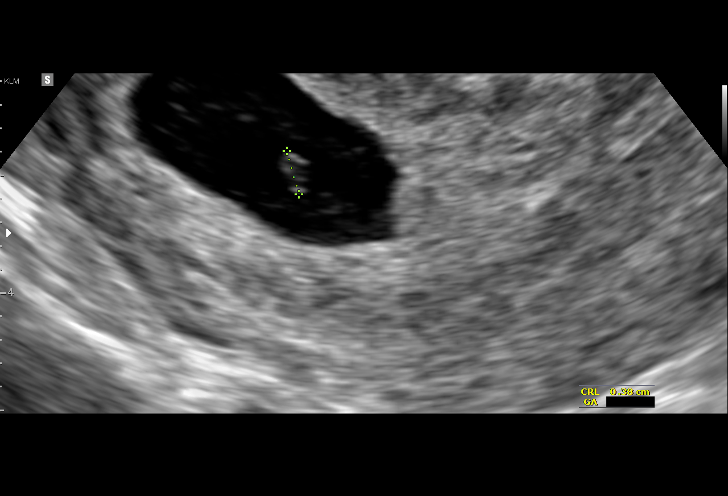
[im 29/37]
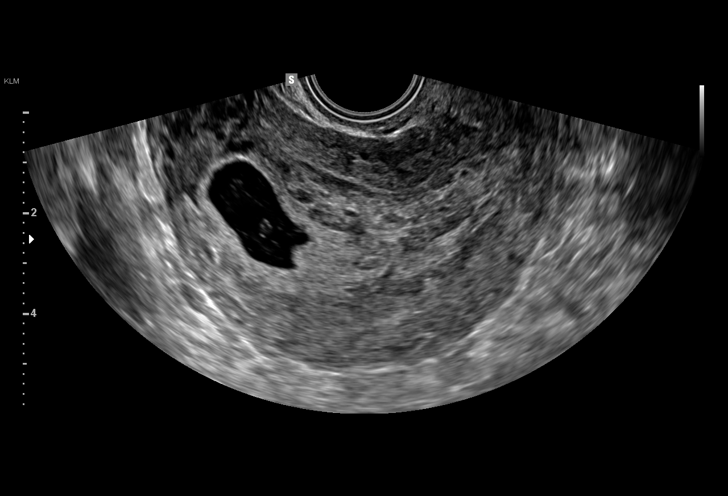
[im 31/37]
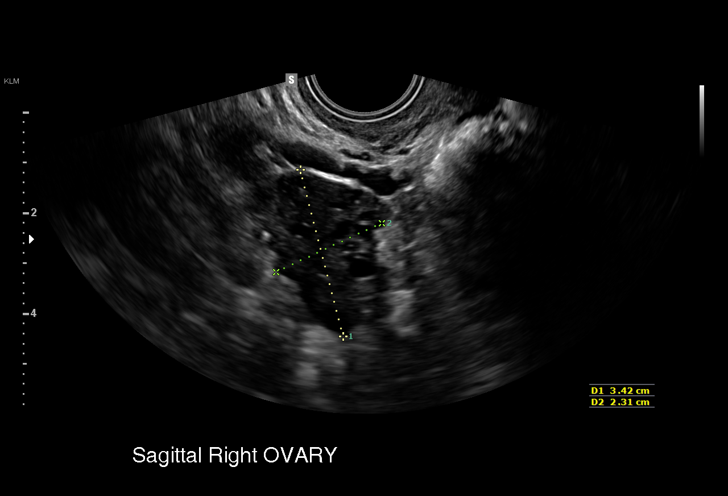
[im 34/37]
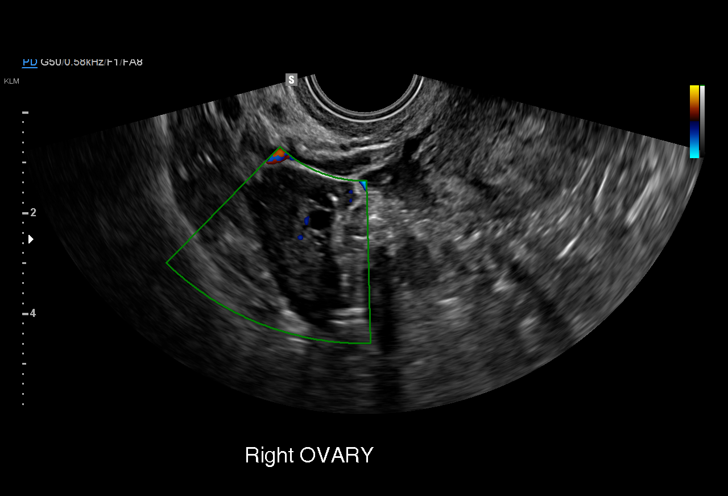
[im 37/37]
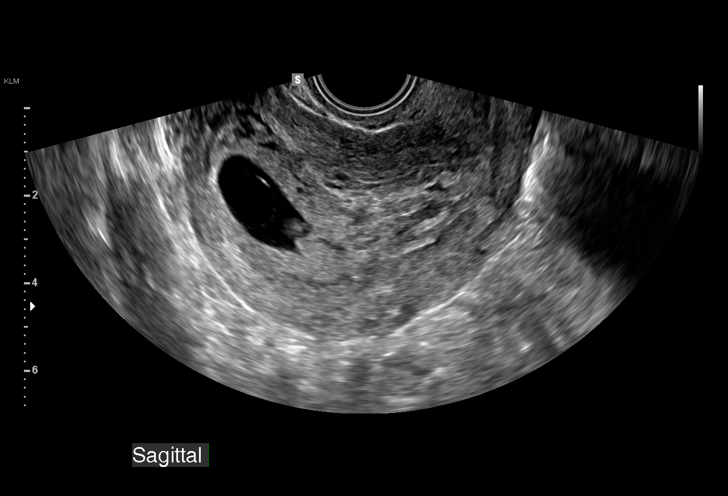

[15 of 28 positions shown; findings below may reference images not displayed]

FINDINGS: Intrauterine gestational sac: Present but irregular in shape

Yolk sac:  Not well visualize

Embryo:  Present

Cardiac Activity: Absent

Heart Rate: N/A  bpm

CRL:  3.8  mm   6 w   1 d                  US EDC: 11/09/2016

Subchorionic hemorrhage:  Nonvisualized

Maternal uterus/adnexae:

Unremarkable ovaries.

No free pelvic fluid or adnexal masses.
IMPRESSION: Gestational sac present within the uterus though the sac appears
irregular in contour.

Small fetal pole is seen without fetal cardiac activity detected.

Findings are suspicious but not yet definitive for failed pregnancy.
Recommend follow-up US in 10-14 days for definitive diagnosis. This
recommendation follows SRU consensus guidelines: Diagnostic Criteria
for Nonviable Pregnancy Early in the First Trimester. N Engl J Med

## 2017-11-30 NOTE — L&D Delivery Note (Signed)
Patient: Shigeko Skibinski MRN: [id number]  Patient is a 27 y.o. now G2P1011 s/p NSVD at 5564w3d, who was admitted for spontaneous onset of labor. ROM 9h 4444m prior to delivery with clear fluid.   Delivery Note At 3:50 PM a viable female was delivered via SVD. Head delivered LOA. No nuchal cord present. Shoulder and body delivered in usual fashion. Infant with spontaneous cry, placed on mother's abdomen, dried and bulb suctioned. Cord clamped x 2 after 1-minute delay, and cut by family member. Cord blood drawn. Placenta delivered spontaneously with gentle cord traction. Fundus firm with massage and Pitocin.   Laceration: 3rd degree perineal tear, 3b Rectal exam was performed prior to delivery, thin wall but still in tact. During delivery, the perineum appeared thick and was about 10-12cm in length. The laceration was short and deep, minimal superficial perineal skin involvement distal to introitus which was repaired with simple-interrupted suture. Relatively narrow-appearing introitus. Repair then pursued in the usual fashion with both 3-0 and 2-0 Vicryl. Double layer closure. Continued oozing once repair completed with friable tissue making additional repair difficult. Decision made to pack. Packing placed with lubricant. Ancef to be given.  Blood Loss  Postpartum Hemorrhage: 2079cc QBL based on bag and weighed sponge counts  Stage III PPH so protocol in place  DIC panel pending  Given patient hypotensive and lightheaded, decision made to transfuse  Infant: APGAR: 9, 9; weight pening Placenta: spontaneous, intact  3 vessel cord GBS: positive, adequate treatment with penicillin  Anesthesia:  epidural Episiotomy: None  Mom to remain on L&D until stable then transfer to mother baby.  Baby to Couplet care / Skin to Skin.  Tamera StandsLaurel S Samay Delcarlo 07/14/2018, 5:39 PM

## 2017-12-06 ENCOUNTER — Other Ambulatory Visit: Payer: Self-pay

## 2017-12-06 ENCOUNTER — Encounter (HOSPITAL_COMMUNITY): Payer: Self-pay | Admitting: Student

## 2017-12-06 ENCOUNTER — Inpatient Hospital Stay (HOSPITAL_COMMUNITY)
Admission: AD | Admit: 2017-12-06 | Discharge: 2017-12-06 | Disposition: A | Discharge status: Home / Self Care | Payer: Medicaid Other | Source: Ambulatory Visit | Attending: Obstetrics and Gynecology | Admitting: Obstetrics and Gynecology

## 2017-12-06 DIAGNOSIS — Z3491 Encounter for supervision of normal pregnancy, unspecified, first trimester: Secondary | ICD-10-CM | POA: Diagnosis not present

## 2017-12-06 DIAGNOSIS — Z3A09 9 weeks gestation of pregnancy: Secondary | ICD-10-CM | POA: Diagnosis not present

## 2017-12-06 DIAGNOSIS — Z1389 Encounter for screening for other disorder: Secondary | ICD-10-CM

## 2017-12-06 LAB — URINALYSIS, ROUTINE W REFLEX MICROSCOPIC
Bilirubin Urine: NEGATIVE
Glucose, UA: NEGATIVE mg/dL
Hgb urine dipstick: NEGATIVE
Ketones, ur: NEGATIVE mg/dL
Leukocytes, UA: NEGATIVE
Nitrite: NEGATIVE
Protein, ur: NEGATIVE mg/dL
Specific Gravity, Urine: 1.002 — ABNORMAL LOW (ref 1.005–1.030)
pH: 6 (ref 5.0–8.0)

## 2017-12-06 LAB — OB RESULTS CONSOLE RPR: RPR: NONREACTIVE

## 2017-12-06 LAB — OB RESULTS CONSOLE ABO/RH: RH Type: POSITIVE

## 2017-12-06 LAB — OB RESULTS CONSOLE VARICELLA ZOSTER ANTIBODY, IGG: Varicella: IMMUNE

## 2017-12-06 LAB — OB RESULTS CONSOLE GC/CHLAMYDIA
Chlamydia: NEGATIVE
Gonorrhea: NEGATIVE

## 2017-12-06 LAB — OB RESULTS CONSOLE HGB/HCT, BLOOD: Hemoglobin: 12.7

## 2017-12-06 LAB — OB RESULTS CONSOLE HEPATITIS B SURFACE ANTIGEN: Hepatitis B Surface Ag: NEGATIVE

## 2017-12-06 LAB — OB RESULTS CONSOLE RUBELLA ANTIBODY, IGM: Rubella: IMMUNE

## 2017-12-06 LAB — OB RESULTS CONSOLE HIV ANTIBODY (ROUTINE TESTING): HIV: NONREACTIVE

## 2017-12-06 NOTE — MAU Note (Signed)
Pt presents to MAU after being [facility] for routine OB appointment with no FH tones.

## 2017-12-06 NOTE — MAU Provider Note (Signed)
  History     CSN: [id number]  Arrival date and time: 12/06/17 1538   First Provider Initiated Contact with Patient 12/06/17 1627      Chief Complaint  Patient presents with  . No Fetal Heart Tones   HPI  Misty Kelly is a 27 y.o. G2P0010 at 7635w0d by unsure LMP who presents from the office. [facility] & they were unable to obtain FHT by doppler. Ultrasound not available so was sent here for further evaluation. Patient states LMP sometime in September but unsure of when. Denies abdominal pain or vaginal bleeding.   OB History    Gravida Para Term Preterm AB Living   2 0 0 0 1 0   SAB TAB Ectopic Multiple Live Births   1 0 0 0        Past Medical History:  Diagnosis Date  . Allergy   . STD (sexually transmitted disease) 01/2014   Tx'd for Chlamydia--did have neg. test of cure  . Urinary incontinence     Past Surgical History:  Procedure Laterality Date  . NO PAST SURGERIES      Family History  Problem Relation Age of Onset  . Hyperlipidemia Brother   . Hypertension Brother   . Cancer Maternal Grandfather        Lung Ca  . Stroke Paternal Grandfather   . Cancer Maternal Grandmother [id number]        Dec Esophogeal Ca  . Hypertension Mother     Social History   Tobacco Use  . Smoking status: Never Smoker  . Smokeless tobacco: Never Used  Substance Use Topics  . Alcohol use: Yes    Alcohol/week: 0.0 oz    Comment: only special occasions  . Drug use: No    Allergies: No Known Allergies  Medications Prior to Admission  Medication Sig Dispense Refill Last Dose  . Prenatal Vit-Fe Fumarate-FA (PRENATAL MULTIVITAMIN) TABS tablet Take 1 tablet by mouth daily at 12 noon.   12/05/2017 at Unknown time    Review of Systems  Constitutional: Negative.   Gastrointestinal: Negative.   Genitourinary: Negative.    Physical Exam   Blood pressure 136/89, pulse 88, temperature 98.2 F (36.8 C), temperature source Oral, resp. rate 18, height 5\' 5"  (1.651  m), weight 182 lb (82.6 kg), last menstrual period 08/27/2017, unknown if currently breastfeeding.  Physical Exam  Nursing note and vitals reviewed. Constitutional: She is oriented to person, place, and time. She appears well-developed and well-nourished. No distress.  HENT:  Head: Normocephalic and atraumatic.  Eyes: Conjunctivae are normal. Right eye exhibits no discharge. Left eye exhibits no discharge. No scleral icterus.  Neck: Normal range of motion.  Respiratory: Effort normal. No respiratory distress.  GI: Soft. There is no tenderness.  Neurological: She is alert and oriented to person, place, and time.  Skin: Skin is warm and dry. She is not diaphoretic.  Psychiatric: She has a normal mood and affect. Her behavior is normal. Judgment and thought content normal.    MAU Course  Procedures  MDM Informal bedside ultrasound performed. IUP measuring 7915w1d by CRL. FHR 160s. Dating updated in system. Pt reassured.  Assessment and Plan  A: 1. [redacted] weeks gestation of pregnancy   2. Fetal heart tones present, first trimester    P: Discharge home Call [facility] for follow up & notify of updated EDD Discussed reasons to return to MAU  Judeth HornErin Arnett Duddy 12/06/2017, 4:27 PM

## 2017-12-06 NOTE — Discharge Instructions (Signed)

## 2017-12-22 ENCOUNTER — Other Ambulatory Visit: Payer: Self-pay | Admitting: Family

## 2017-12-22 DIAGNOSIS — N6320 Unspecified lump in the left breast, unspecified quadrant: Secondary | ICD-10-CM

## 2017-12-27 ENCOUNTER — Other Ambulatory Visit: Payer: Self-pay | Admitting: Obstetrics & Gynecology

## 2017-12-27 DIAGNOSIS — N6320 Unspecified lump in the left breast, unspecified quadrant: Secondary | ICD-10-CM

## 2017-12-28 ENCOUNTER — Other Ambulatory Visit: Payer: Self-pay | Admitting: Obstetrics & Gynecology

## 2017-12-28 ENCOUNTER — Ambulatory Visit
Admission: RE | Admit: 2017-12-28 | Discharge: 2017-12-28 | Disposition: A | Discharge status: Home / Self Care | Payer: Medicaid Other | Source: Ambulatory Visit | Attending: Obstetrics & Gynecology | Admitting: Obstetrics & Gynecology

## 2017-12-28 DIAGNOSIS — N6320 Unspecified lump in the left breast, unspecified quadrant: Secondary | ICD-10-CM

## 2017-12-29 ENCOUNTER — Ambulatory Visit
Admission: RE | Admit: 2017-12-29 | Discharge: 2017-12-29 | Disposition: A | Discharge status: Home / Self Care | Payer: Medicaid Other | Source: Ambulatory Visit | Attending: Obstetrics & Gynecology | Admitting: Obstetrics & Gynecology

## 2017-12-29 DIAGNOSIS — N6320 Unspecified lump in the left breast, unspecified quadrant: Secondary | ICD-10-CM

## 2017-12-30 ENCOUNTER — Other Ambulatory Visit (HOSPITAL_COMMUNITY): Payer: Self-pay | Admitting: Nurse Practitioner

## 2017-12-30 DIAGNOSIS — Z3682 Encounter for antenatal screening for nuchal translucency: Secondary | ICD-10-CM

## 2017-12-30 DIAGNOSIS — Z3A13 13 weeks gestation of pregnancy: Secondary | ICD-10-CM

## 2018-01-04 ENCOUNTER — Encounter (HOSPITAL_COMMUNITY): Payer: Self-pay

## 2018-01-04 ENCOUNTER — Ambulatory Visit (HOSPITAL_COMMUNITY)
Admission: RE | Admit: 2018-01-04 | Discharge: 2018-01-04 | Disposition: A | Discharge status: Home / Self Care | Payer: Medicaid Other | Source: Ambulatory Visit | Attending: Nurse Practitioner | Admitting: Nurse Practitioner

## 2018-01-04 DIAGNOSIS — Z36 Encounter for antenatal screening for chromosomal anomalies: Secondary | ICD-10-CM | POA: Diagnosis not present

## 2018-01-04 DIAGNOSIS — Z3A13 13 weeks gestation of pregnancy: Secondary | ICD-10-CM | POA: Insufficient documentation

## 2018-01-04 DIAGNOSIS — Z3682 Encounter for antenatal screening for nuchal translucency: Secondary | ICD-10-CM

## 2018-01-07 ENCOUNTER — Other Ambulatory Visit: Payer: Self-pay

## 2018-01-14 ENCOUNTER — Other Ambulatory Visit: Payer: Self-pay

## 2018-01-14 ENCOUNTER — Inpatient Hospital Stay (HOSPITAL_COMMUNITY)
Admission: AD | Admit: 2018-01-14 | Discharge: 2018-01-14 | Discharge status: Against Medical Advice | Payer: Medicaid Other | Source: Ambulatory Visit | Attending: Obstetrics and Gynecology | Admitting: Obstetrics and Gynecology

## 2018-01-14 DIAGNOSIS — Z5321 Procedure and treatment not carried out due to patient leaving prior to being seen by health care provider: Secondary | ICD-10-CM | POA: Insufficient documentation

## 2018-01-14 LAB — URINALYSIS, ROUTINE W REFLEX MICROSCOPIC
Bilirubin Urine: NEGATIVE
Glucose, UA: NEGATIVE mg/dL
Hgb urine dipstick: NEGATIVE
Ketones, ur: NEGATIVE mg/dL
Nitrite: NEGATIVE
Protein, ur: NEGATIVE mg/dL
Specific Gravity, Urine: 1.011 (ref 1.005–1.030)
pH: 7 (ref 5.0–8.0)

## 2018-01-14 NOTE — MAU Note (Signed)
Pt presents with c/o left breat soreness that began the 1st week of February.  Reports had breast biopsy on December 29, 2017.  Reports breast is warm to touch.  Denies fever or oozing from site

## 2018-01-15 ENCOUNTER — Encounter (HOSPITAL_COMMUNITY): Payer: Self-pay

## 2018-01-15 ENCOUNTER — Inpatient Hospital Stay (HOSPITAL_COMMUNITY)
Admission: AD | Admit: 2018-01-15 | Discharge: 2018-01-15 | Disposition: A | Discharge status: Home / Self Care | Payer: Medicaid Other | Source: Ambulatory Visit | Attending: Obstetrics and Gynecology | Admitting: Obstetrics and Gynecology

## 2018-01-15 DIAGNOSIS — N644 Mastodynia: Secondary | ICD-10-CM | POA: Diagnosis not present

## 2018-01-15 DIAGNOSIS — N6452 Nipple discharge: Secondary | ICD-10-CM | POA: Insufficient documentation

## 2018-01-15 DIAGNOSIS — Z8249 Family history of ischemic heart disease and other diseases of the circulatory system: Secondary | ICD-10-CM | POA: Diagnosis not present

## 2018-01-15 DIAGNOSIS — O26892 Other specified pregnancy related conditions, second trimester: Secondary | ICD-10-CM | POA: Diagnosis present

## 2018-01-15 DIAGNOSIS — Z801 Family history of malignant neoplasm of trachea, bronchus and lung: Secondary | ICD-10-CM | POA: Diagnosis not present

## 2018-01-15 DIAGNOSIS — N61 Mastitis without abscess: Secondary | ICD-10-CM | POA: Insufficient documentation

## 2018-01-15 DIAGNOSIS — Z800 Family history of malignant neoplasm of digestive organs: Secondary | ICD-10-CM | POA: Insufficient documentation

## 2018-01-15 DIAGNOSIS — Z823 Family history of stroke: Secondary | ICD-10-CM | POA: Diagnosis not present

## 2018-01-15 DIAGNOSIS — Z9889 Other specified postprocedural states: Secondary | ICD-10-CM | POA: Diagnosis not present

## 2018-01-15 DIAGNOSIS — Z3A14 14 weeks gestation of pregnancy: Secondary | ICD-10-CM | POA: Insufficient documentation

## 2018-01-15 MED ORDER — CEPHALEXIN 500 MG PO CAPS: 500.0000 mg | ORAL_CAPSULE | Freq: Four times a day (QID) | ORAL | 0 refills | Status: DC

## 2018-01-15 NOTE — Discharge Instructions (Signed)

## 2018-01-15 NOTE — MAU Note (Signed)
Pt had a breast biopsy 12/29/17. Is having some redness and itching and called and was told to follow up with the health department for antibiotics. Went yesterday but there was no one available to see her. Was told to come to MAU. Came yesterday but didn't wait to be seen.

## 2018-01-15 NOTE — MAU Provider Note (Signed)
History   G2P0010 @ 14.5 wks in with c/o left breast red and tender. States had biopsy 12/29/17 and a week ago started to get tender and red.   CSN: [id number]  Arrival date & time 01/15/18  1107   None     Chief Complaint  Patient presents with  . Breast Discharge    HPI  Past Medical History:  Diagnosis Date  . Allergy   . STD (sexually transmitted disease) 01/2014   Tx'd for Chlamydia--did have neg. test of cure  . Urinary incontinence     Past Surgical History:  Procedure Laterality Date  . BREAST BIOPSY      Family History  Problem Relation Age of Onset  . Hyperlipidemia Brother   . Hypertension Brother   . Cancer Maternal Grandfather        Lung Ca  . Stroke Paternal Grandfather   . Cancer Maternal Grandmother [id number]        Dec Esophogeal Ca  . Hypertension Mother     Social History   Tobacco Use  . Smoking status: Never Smoker  . Smokeless tobacco: Never Used  Substance Use Topics  . Alcohol use: No    Alcohol/week: 0.0 oz    Frequency: Never    Comment: only special occasions  . Drug use: No    OB History    Gravida Para Term Preterm AB Living   2 0 0 0 1 0   SAB TAB Ectopic Multiple Live Births   1 0 0 0        Review of Systems  Constitutional: Negative.   HENT: Negative.   Eyes: Negative.   Respiratory: Negative.   Cardiovascular: Negative.   Gastrointestinal: Negative.   Endocrine: Negative.   Genitourinary: Negative.   Musculoskeletal: Negative.   Skin:       Area on left lower breast red, tender and warm to touch where she had biopsy.  Neurological: Negative.   Hematological: Negative.   Psychiatric/Behavioral: Negative.     Allergies  Patient has no known allergies.  Home Medications    BP 133/71 (BP Location: Left Arm)   Pulse 95   Temp 98.5 F (36.9 C) (Oral)   Resp 16   Wt 180 lb 1 oz (81.7 kg)   LMP 08/27/2017   BMI 29.96 kg/m   Physical Exam  Constitutional: She is oriented to person, place, and time. She  appears well-developed and well-nourished.  HENT:  Head: Normocephalic.  Eyes: Pupils are equal, round, and reactive to light.  Neck: Normal range of motion.  Cardiovascular: Normal rate, regular rhythm, normal heart sounds and intact distal pulses.  Pulmonary/Chest: Effort normal and breath sounds normal.  Abdominal: Soft. Bowel sounds are normal.  Musculoskeletal: Normal range of motion.  Neurological: She is alert and oriented to person, place, and time. She has normal reflexes.  Skin: Skin is warm and dry.  At 0600 on left breast where biopsy was done there is a 4cm, red, warm to touch area.    Psychiatric: She has a normal mood and affect. Her behavior is normal. Judgment and thought content normal.    MAU Course  Procedures (including critical care time)  Labs Reviewed - No data to display No results found.   1. Breast pain, left   2. Cellulitis of left breast       MDM  VSS, FHR 156 st and reg with doppler. At 0600 on left breast where biopsy was done there is a 4cm,  red, warm to touch area. Will treat with keflex QID x 10 days. D/c home

## 2018-03-12 ENCOUNTER — Encounter (HOSPITAL_COMMUNITY): Payer: Self-pay

## 2018-03-12 ENCOUNTER — Other Ambulatory Visit: Payer: Self-pay

## 2018-03-12 ENCOUNTER — Inpatient Hospital Stay (HOSPITAL_COMMUNITY)
Admission: AD | Admit: 2018-03-12 | Discharge: 2018-03-12 | Disposition: A | Discharge status: Home / Self Care | Payer: Medicaid Other | Source: Ambulatory Visit | Attending: Obstetrics & Gynecology | Admitting: Obstetrics & Gynecology

## 2018-03-12 DIAGNOSIS — Z3A22 22 weeks gestation of pregnancy: Secondary | ICD-10-CM | POA: Diagnosis not present

## 2018-03-12 DIAGNOSIS — O368120 Decreased fetal movements, second trimester, not applicable or unspecified: Secondary | ICD-10-CM | POA: Diagnosis not present

## 2018-03-12 NOTE — MAU Note (Signed)
Has been feeling flutters, hasn't felt any movement since 6 last night. No pain  Or bleeding.

## 2018-03-12 NOTE — MAU Note (Signed)
Pt reassured after hearing heart beat.

## 2018-03-12 NOTE — MAU Provider Note (Signed)
History   G2P0010 @ 22.5 wks in with decreased fetal movement. States has not felt baby move all day. But since arrival to unit feels baby move.  CSN: [id number]  Arrival date & time 03/12/18  1522   First Provider Initiated Contact with Patient 03/12/18 1652      Chief Complaint  Patient presents with  . Decreased Fetal Movement    HPI  Past Medical History:  Diagnosis Date  . Allergy   . STD (sexually transmitted disease) 01/2014   Tx'd for Chlamydia--did have neg. test of cure  . Urinary incontinence     Past Surgical History:  Procedure Laterality Date  . BREAST BIOPSY      Family History  Problem Relation Age of Onset  . Hyperlipidemia Brother   . Hypertension Brother   . Cancer Maternal Grandfather        Lung Ca  . Stroke Paternal Grandfather   . Cancer Maternal Grandmother [id number]        Dec Esophogeal Ca  . Hypertension Mother     Social History   Tobacco Use  . Smoking status: Never Smoker  . Smokeless tobacco: Never Used  Substance Use Topics  . Alcohol use: No    Alcohol/week: 0.0 oz    Frequency: Never    Comment: only special occasions  . Drug use: No    OB History    Gravida  2   Para  0   Term  0   Preterm  0   AB  1   Living  0     SAB  1   TAB  0   Ectopic  0   Multiple  0   Live Births              Review of Systems  Constitutional: Negative.   HENT: Negative.   Eyes: Negative.   Respiratory: Negative.   Cardiovascular: Negative.   Gastrointestinal: Negative.   Endocrine: Negative.   Genitourinary: Negative.   Musculoskeletal: Negative.   Skin: Negative.   Allergic/Immunologic: Negative.   Neurological: Negative.   Hematological: Negative.   Psychiatric/Behavioral: Negative.     Allergies  Patient has no known allergies.  Home Medications    BP 125/64 (BP Location: Right Arm)   Pulse 89   Temp 99.4 F (37.4 C)   Resp 17   Wt 181 lb 8 oz (82.3 kg)   LMP 08/27/2017   SpO2 99%   BMI 30.20 kg/m    Physical Exam  MAU Course  Procedures (including critical care time)  Labs Reviewed - No data to display No results found.   1. Decreased fetal movements in second trimester, single or unspecified fetus       MDM  FHR 140's reassuring pattern. Discussed with pt fact that she might not feel movement everyday until 26 wks. Pt verbalized understanding. Will d/c home

## 2018-03-12 NOTE — Discharge Instructions (Signed)

## 2018-03-12 NOTE — MAU Note (Signed)
Urine in lab 

## 2018-04-21 LAB — CYSTIC FIBROSIS DIAGNOSTIC STUDY: Interpretation-CFDNA:: NEGATIVE

## 2018-06-10 ENCOUNTER — Encounter (HOSPITAL_COMMUNITY): Payer: Self-pay

## 2018-06-10 ENCOUNTER — Inpatient Hospital Stay (HOSPITAL_COMMUNITY)
Admission: AD | Admit: 2018-06-10 | Discharge: 2018-06-10 | Disposition: A | Discharge status: Home / Self Care | Payer: BLUE CROSS/BLUE SHIELD | Source: Ambulatory Visit | Attending: Obstetrics & Gynecology | Admitting: Obstetrics & Gynecology

## 2018-06-10 DIAGNOSIS — O2343 Unspecified infection of urinary tract in pregnancy, third trimester: Secondary | ICD-10-CM | POA: Insufficient documentation

## 2018-06-10 DIAGNOSIS — Z3A35 35 weeks gestation of pregnancy: Secondary | ICD-10-CM | POA: Diagnosis not present

## 2018-06-10 DIAGNOSIS — N898 Other specified noninflammatory disorders of vagina: Secondary | ICD-10-CM | POA: Insufficient documentation

## 2018-06-10 DIAGNOSIS — O26893 Other specified pregnancy related conditions, third trimester: Secondary | ICD-10-CM | POA: Diagnosis present

## 2018-06-10 DIAGNOSIS — R109 Unspecified abdominal pain: Secondary | ICD-10-CM | POA: Diagnosis present

## 2018-06-10 DIAGNOSIS — O9989 Other specified diseases and conditions complicating pregnancy, childbirth and the puerperium: Secondary | ICD-10-CM | POA: Insufficient documentation

## 2018-06-10 DIAGNOSIS — Z8249 Family history of ischemic heart disease and other diseases of the circulatory system: Secondary | ICD-10-CM | POA: Insufficient documentation

## 2018-06-10 DIAGNOSIS — M791 Myalgia, unspecified site: Secondary | ICD-10-CM | POA: Insufficient documentation

## 2018-06-10 DIAGNOSIS — Z3483 Encounter for supervision of other normal pregnancy, third trimester: Secondary | ICD-10-CM

## 2018-06-10 DIAGNOSIS — O99810 Abnormal glucose complicating pregnancy: Secondary | ICD-10-CM

## 2018-06-10 DIAGNOSIS — Z3689 Encounter for other specified antenatal screening: Secondary | ICD-10-CM

## 2018-06-10 LAB — WET PREP, GENITAL
Clue Cells Wet Prep HPF POC: NONE SEEN
Sperm: NONE SEEN
Trich, Wet Prep: NONE SEEN
Yeast Wet Prep HPF POC: NONE SEEN

## 2018-06-10 LAB — URINALYSIS, ROUTINE W REFLEX MICROSCOPIC
Bilirubin Urine: NEGATIVE
Glucose, UA: NEGATIVE mg/dL
Hgb urine dipstick: NEGATIVE
Ketones, ur: NEGATIVE mg/dL
Nitrite: NEGATIVE
Protein, ur: NEGATIVE mg/dL
Specific Gravity, Urine: 1.009 (ref 1.005–1.030)
pH: 6 (ref 5.0–8.0)

## 2018-06-10 MED ORDER — CEPHALEXIN 500 MG PO CAPS: 500.0000 mg | ORAL_CAPSULE | Freq: Four times a day (QID) | ORAL | 0 refills | Status: AC

## 2018-06-10 MED ORDER — CYCLOBENZAPRINE HCL 10 MG PO TABS: 10.0000 mg | ORAL_TABLET | Freq: Two times a day (BID) | ORAL | 0 refills | Status: DC | PRN

## 2018-06-10 NOTE — MAU Note (Signed)
For past 3 days having pain L side. Happens when laying on L side and also when I move to get up. Having some white, watery discharge for several wks. Mentioned to health dept and told was normal.

## 2018-06-10 NOTE — MAU Provider Note (Addendum)
History     CSN: [id number]  Arrival date and time: 06/10/18 1919   None     Chief Complaint  Patient presents with  . Pain on left side   HPI Misty Kelly is a 27 y.o. G2P0010 at 2183w4d who presents to MAU with complaints of left-sided abdominal pain and increased vaginal discharge. Denies vaginal bleeding, leaking of fluid, decreased fetal movement, fever, falls, or recent illness.    Left Abdominal Pain This is a new problem. Patient reports mild 3-4/10 left abdominal pain when she lies on her left side or repositions. Pain does not radiate, no aggravating factors. Reports that pain completely resolves with repositioning.   Increased Vaginal Discharge This is a recent problem. Patient reports new onset watery discharge for the past couple weeks. M[facility] and was told it was normal third trimester discharge.  OB History    Gravida  2   Para  0   Term  0   Preterm  0   AB  1   Living  0     SAB  1   TAB  0   Ectopic  0   Multiple  0   Live Births              Past Medical History:  Diagnosis Date  . Allergy   . STD (sexually transmitted disease) 01/2014   Tx'd for Chlamydia--did have neg. test of cure  . Urinary incontinence     Past Surgical History:  Procedure Laterality Date  . BREAST BIOPSY      Family History  Problem Relation Age of Onset  . Hyperlipidemia Brother   . Hypertension Brother   . Cancer Maternal Grandfather        Lung Ca  . Stroke Paternal Grandfather   . Cancer Maternal Grandmother 1670        Dec Esophogeal Ca  . Hypertension Mother     Social History   Tobacco Use  . Smoking status: Never Smoker  . Smokeless tobacco: Never Used  Substance Use Topics  . Alcohol use: No    Alcohol/week: 0.0 oz    Frequency: Never    Comment: only special occasions  . Drug use: No    Allergies: No Known Allergies  Medications Prior to Admission  Medication Sig Dispense Refill Last Dose  .  cephALEXin (KEFLEX) 500 MG capsule Take 1 capsule (500 mg total) by mouth 4 (four) times daily. 40 capsule 0 Past Month at Unknown time  . cetirizine (ZYRTEC) 10 MG tablet Take 10 mg by mouth daily.     . Prenatal Vit-Fe Fumarate-FA (PRENATAL MULTIVITAMIN) TABS tablet Take 1 tablet by mouth daily at 12 noon.   03/12/2018 at Unknown time    Review of Systems  Constitutional: Negative for fever.  Gastrointestinal: Negative for abdominal pain, nausea and vomiting.  Genitourinary: Negative for vaginal bleeding, vaginal discharge and vaginal pain.  Musculoskeletal: Positive for myalgias.  Neurological: Negative for headaches.  All other systems reviewed and are negative.  Physical Exam   Blood pressure (!) 106/52, pulse (!) 101, temperature 98.1 F (36.7 C), height 5\' 5"  (1.651 m), weight 195 lb (88.5 kg), last menstrual period 08/27/2017, unknown if currently breastfeeding.  Physical Exam  Nursing note and vitals reviewed. Constitutional: She is oriented to person, place, and time. She appears well-developed and well-nourished.  HENT:  Head: Normocephalic.  Cardiovascular: Normal rate, regular rhythm, normal heart sounds and intact distal pulses.  Respiratory: Effort normal and breath sounds normal.  GI: There is no tenderness. There is no rebound and no guarding.  Gravid  Genitourinary: Uterus normal. Vaginal discharge found.  Genitourinary Comments: Thick white vaginal discharge clustered near cervical os. No foul odor  Musculoskeletal: Normal range of motion.  Neurological: She is alert and oriented to person, place, and time. She has normal reflexes.  Skin: Skin is warm and dry.  Psychiatric: She has a normal mood and affect. Her behavior is normal. Judgment and thought content normal.    MAU Course  Procedures  MDM --Muscle pain related to abdominal muscle strain with gravid abdomen --Reactive NST: baseline 135, moderate variability, positive accelerations, no decelerations   --Toco:rare contractions not felt by patient --Cervix closed and posterior  Patient Vitals for the past 24 hrs:  BP Temp Pulse Height Weight  06/10/18 1939 (!) 106/52 98.1 F (36.7 C) (!) 101 5\' 5"  (1.651 m) 195 lb (88.5 kg)    Orders Placed This Encounter  Procedures  . Wet prep, genital    Standing Status:   Standing    Number of Occurrences:   1  . Culture, OB Urine    Standing Status:   Standing    Number of Occurrences:   1  . Urinalysis, Routine w reflex microscopic    Standing Status:   Standing    Number of Occurrences:   1  . Discharge patient Discharge disposition: 01-Home or Self Care; Discharge patient date: 06/10/2018    Standing Status:   Standing    Number of Occurrences:   1    Order Specific Question:   Discharge disposition    Answer:   01-Home or Self Care [1]    Order Specific Question:   Discharge patient date    Answer:   06/10/2018   Results for orders placed or performed during the hospital encounter of 06/10/18 (from the past 24 hour(s))  Urinalysis, Routine w reflex microscopic     Status: Abnormal   Collection Time: 06/10/18  7:47 PM  Result Value Ref Range   Color, Urine YELLOW YELLOW   APPearance HAZY (A) CLEAR   Specific Gravity, Urine 1.009 1.005 - 1.030   pH 6.0 5.0 - 8.0   Glucose, UA NEGATIVE NEGATIVE mg/dL   Hgb urine dipstick NEGATIVE NEGATIVE   Bilirubin Urine NEGATIVE NEGATIVE   Ketones, ur NEGATIVE NEGATIVE mg/dL   Protein, ur NEGATIVE NEGATIVE mg/dL   Nitrite NEGATIVE NEGATIVE   Leukocytes, UA MODERATE (A) NEGATIVE   RBC / HPF 0-5 0 - 5 RBC/hpf   WBC, UA 6-10 0 - 5 WBC/hpf   Bacteria, UA MANY (A) NONE SEEN   Squamous Epithelial / LPF 0-5 0 - 5   Mucus PRESENT   Wet prep, genital     Status: Abnormal   Collection Time: 06/10/18  8:31 PM  Result Value Ref Range   Yeast Wet Prep HPF POC NONE SEEN NONE SEEN   Trich, Wet Prep NONE SEEN NONE SEEN   Clue Cells Wet Prep HPF POC NONE SEEN NONE SEEN   WBC, Wet Prep HPF POC MODERATE  (A) NONE SEEN   Sperm NONE SEEN     Meds ordered this encounter  Medications  . cephALEXin (KEFLEX) 500 MG capsule    Sig: Take 1 capsule (500 mg total) by mouth 4 (four) times daily for 10 days.    Dispense:  40 capsule    Refill:  0    Order Specific Question:  Supervising Provider    Answer:   Reva Bores [[id number]]  . cyclobenzaprine (FLEXERIL) 10 MG tablet    Sig: Take 1 tablet (10 mg total) by mouth 2 (two) times daily as needed for muscle spasms.    Dispense:  20 tablet    Refill:  0    Order Specific Question:   Supervising Provider    Answer:   Reva Bores [[id number]]   Assessment and Plan  --27 y.o. G2P0010 at [redacted]w[redacted]d  --Reactive NST --UTI in pregnancy, rx for Keflex sent to pharmacy on record, urine culture ordered --Normal vaginal discharge --Muscle pain in pregnancy, paper rx for Flexeril given to patient --Reviewed general obstetric precautions including but not limited to falls, fever, vaginal bleeding, leaking of fluid,    decreased fetal movement, headache not relieved by Tylenol, rest and PO hydration.  --Discharge home in stable condition  F/U: next OB appt at [facility] Thursday 06/16/2018  Calvert Cantor, CNM 06/10/2018, 9:17 PM

## 2018-06-10 NOTE — Discharge Instructions (Signed)

## 2018-06-12 LAB — CULTURE, OB URINE: Culture: <10000 — AB

## 2018-06-13 LAB — GC/CHLAMYDIA PROBE AMP (~~LOC~~) NOT AT ARMC
Chlamydia: NEGATIVE
Neisseria Gonorrhea: NEGATIVE

## 2018-06-22 ENCOUNTER — Other Ambulatory Visit: Payer: Self-pay

## 2018-06-22 ENCOUNTER — Inpatient Hospital Stay (HOSPITAL_COMMUNITY)
Admission: AD | Admit: 2018-06-22 | Discharge: 2018-06-22 | Disposition: A | Discharge status: Home / Self Care | Payer: Medicaid Other | Source: Ambulatory Visit | Attending: Obstetrics and Gynecology | Admitting: Obstetrics and Gynecology

## 2018-06-22 ENCOUNTER — Encounter (HOSPITAL_COMMUNITY): Payer: Self-pay

## 2018-06-22 DIAGNOSIS — Z3483 Encounter for supervision of other normal pregnancy, third trimester: Secondary | ICD-10-CM | POA: Insufficient documentation

## 2018-06-22 DIAGNOSIS — Z3689 Encounter for other specified antenatal screening: Secondary | ICD-10-CM

## 2018-06-22 DIAGNOSIS — N898 Other specified noninflammatory disorders of vagina: Secondary | ICD-10-CM | POA: Diagnosis not present

## 2018-06-22 DIAGNOSIS — Z3A37 37 weeks gestation of pregnancy: Secondary | ICD-10-CM | POA: Diagnosis not present

## 2018-06-22 DIAGNOSIS — O9989 Other specified diseases and conditions complicating pregnancy, childbirth and the puerperium: Secondary | ICD-10-CM | POA: Diagnosis not present

## 2018-06-22 DIAGNOSIS — Z0371 Encounter for suspected problem with amniotic cavity and membrane ruled out: Secondary | ICD-10-CM

## 2018-06-22 LAB — POCT FERN TEST: POCT Fern Test: NEGATIVE

## 2018-06-22 LAB — AMNISURE RUPTURE OF MEMBRANE (ROM) NOT AT ARMC: Amnisure ROM: NEGATIVE

## 2018-06-22 NOTE — MAU Provider Note (Signed)
S: Ms. Larena Soxrush Pangting is a 27 y.o. G2P0010 at 5422w2d  who presents to MAU today complaining of leaking of fluid since around 4pm [facility]. She reports she had a gush of fluid after using the restroom- Crist FatFern was done at HD and noted to be negative, she reports continued leaking of fluid while at the HD but has not had any leaking since being here. She denies vaginal bleeding. She denies contractions. She reports normal fetal movement.    O: BP 109/75 (BP Location: Right Arm)   Pulse 96   Temp 98.5 F (36.9 C) (Oral)   Resp 16   Wt 195 lb 8 oz (88.7 kg)   LMP 08/27/2017   SpO2 100%   BMI 32.53 kg/m  GENERAL: Well-developed, well-nourished female in no acute distress.  HEAD: Normocephalic, atraumatic.  CHEST: Normal effort of breathing, regular heart rate ABDOMEN: Soft, nontender, gravid Cervical exam: deferred patient not complaining of contractions, cramping or abdominal pain    Fetal Monitoring: Baseline: 130 Variability: moderate Accelerations: present Decelerations: none Contractions: 1-2 mild contractions noted on monitor   Results for orders placed or performed during the hospital encounter of 06/22/18 (from the past 24 hour(s))  Fern Test     Status: None   Collection Time: 06/22/18  5:56 PM  Result Value Ref Range   POCT Fern Test Negative = intact amniotic membranes   Amnisure [facility] membrane (rom)[facility]     Status: None   Collection Time: 06/22/18  6:06 PM  Result Value Ref Range   Amnisure ROM NEGATIVE    A: SIUP at 1422w2d  Membranes intact- Amnisure negative   P: Discharge home   Follow up as scheduled for prenatal appointments  Return to MAU as needed for labor evaluation, leaking of fluid, vaginal bleeding and/or decreased fetal movement   Sharyon CableRogers, Zayvion Stailey C, CNM 06/23/2018 1:18 PM

## 2018-06-22 NOTE — MAU Note (Signed)
Started leaking at the health dept.  Had a gush.  They had called - fern.  Sent for further eval. Has not had more since.

## 2018-06-22 NOTE — Discharge Instructions (Signed)

## 2018-07-13 ENCOUNTER — Inpatient Hospital Stay (HOSPITAL_COMMUNITY)
Admission: AD | Admit: 2018-07-13 | Discharge: 2018-07-16 | DRG: 768 | Disposition: A | Discharge status: Home / Self Care | Payer: BLUE CROSS/BLUE SHIELD | Attending: Obstetrics & Gynecology | Admitting: Obstetrics & Gynecology

## 2018-07-13 ENCOUNTER — Other Ambulatory Visit: Payer: Self-pay

## 2018-07-13 DIAGNOSIS — Z3A40 40 weeks gestation of pregnancy: Secondary | ICD-10-CM | POA: Diagnosis not present

## 2018-07-13 DIAGNOSIS — O99810 Abnormal glucose complicating pregnancy: Secondary | ICD-10-CM | POA: Diagnosis present

## 2018-07-13 DIAGNOSIS — D565 Hemoglobin E-beta thalassemia: Secondary | ICD-10-CM | POA: Diagnosis present

## 2018-07-13 DIAGNOSIS — O9081 Anemia of the puerperium: Secondary | ICD-10-CM | POA: Diagnosis not present

## 2018-07-13 DIAGNOSIS — Z3483 Encounter for supervision of other normal pregnancy, third trimester: Secondary | ICD-10-CM

## 2018-07-13 DIAGNOSIS — B951 Streptococcus, group B, as the cause of diseases classified elsewhere: Secondary | ICD-10-CM | POA: Diagnosis present

## 2018-07-13 DIAGNOSIS — O99824 Streptococcus B carrier state complicating childbirth: Secondary | ICD-10-CM | POA: Diagnosis present

## 2018-07-13 LAB — CBC
HCT: 38.2 % (ref 36.0–46.0)
Hemoglobin: 12.8 g/dL (ref 12.0–15.0)
MCH: 26 pg (ref 26.0–34.0)
MCHC: 33.5 g/dL (ref 30.0–36.0)
MCV: 77.5 fL — ABNORMAL LOW (ref 78.0–100.0)
Platelets: 249 10*3/uL (ref 150–400)
RBC: 4.93 MIL/uL (ref 3.87–5.11)
RDW: 14.3 % (ref 11.5–15.5)
WBC: 7.5 10*3/uL (ref 4.0–10.5)

## 2018-07-13 LAB — OB RESULTS CONSOLE GBS: GBS: POSITIVE

## 2018-07-13 MED ORDER — SODIUM CHLORIDE 0.9 % IV SOLN
5.0000 10*6.[IU] | Freq: Once | INTRAVENOUS | Status: AC
  Administered 2018-07-13: 5 10*6.[IU] via INTRAVENOUS
  Filled 2018-07-13: qty 5

## 2018-07-13 MED ORDER — ACETAMINOPHEN 325 MG PO TABS: 650.0000 mg | ORAL_TABLET | ORAL | Status: DC | PRN

## 2018-07-13 MED ORDER — OXYCODONE-ACETAMINOPHEN 5-325 MG PO TABS: 2.0000 | ORAL_TABLET | ORAL | Status: DC | PRN

## 2018-07-13 MED ORDER — LACTATED RINGERS IV SOLN
500.0000 mL | INTRAVENOUS | Status: DC | PRN
  Administered 2018-07-14: 1000 mL via INTRAVENOUS

## 2018-07-13 MED ORDER — LACTATED RINGERS IV SOLN
INTRAVENOUS | Status: DC
  Administered 2018-07-13 – 2018-07-14 (×2): via INTRAVENOUS

## 2018-07-13 MED ORDER — LIDOCAINE HCL (PF) 1 % IJ SOLN
30.0000 mL | INTRAMUSCULAR | Status: DC | PRN
  Filled 2018-07-13: qty 30

## 2018-07-13 MED ORDER — ONDANSETRON HCL 4 MG/2ML IJ SOLN: 4.0000 mg | Freq: Four times a day (QID) | INTRAMUSCULAR | Status: DC | PRN

## 2018-07-13 MED ORDER — OXYTOCIN BOLUS FROM INFUSION: 500.0000 mL | Freq: Once | INTRAVENOUS | Status: DC

## 2018-07-13 MED ORDER — SOD CITRATE-CITRIC ACID 500-334 MG/5ML PO SOLN: 30.0000 mL | ORAL | Status: DC | PRN

## 2018-07-13 MED ORDER — PENICILLIN G 3 MILLION UNITS IVPB - SIMPLE MED
3.0000 10*6.[IU] | INTRAVENOUS | Status: DC
  Administered 2018-07-13 – 2018-07-14 (×5): 3 10*6.[IU] via INTRAVENOUS
  Filled 2018-07-13: qty 3
  Filled 2018-07-13: qty 100
  Filled 2018-07-13: qty 3
  Filled 2018-07-13: qty 100
  Filled 2018-07-13 (×2): qty 3
  Filled 2018-07-13: qty 100
  Filled 2018-07-13: qty 3

## 2018-07-13 MED ORDER — OXYTOCIN 40 UNITS IN LACTATED RINGERS INFUSION - SIMPLE MED
2.5000 [IU]/h | INTRAVENOUS | Status: DC
  Administered 2018-07-14: 2.5 [IU]/h via INTRAVENOUS

## 2018-07-13 MED ORDER — OXYCODONE-ACETAMINOPHEN 5-325 MG PO TABS: 1.0000 | ORAL_TABLET | ORAL | Status: DC | PRN

## 2018-07-13 NOTE — Progress Notes (Signed)
Misty Kelly is a 27 y.o. G2P0010 at 3218w2d admitted for SOL.  Subjective: Patient requesting epidural. Otherwise no complaints.  Objective: BP 117/78   Pulse 86   Temp 98.4 F (36.9 C) (Oral)   Resp 16   Ht 5\' 5"  (1.651 m)   Wt 89.4 kg   LMP 08/27/2017   SpO2 98%   BMI 32.78 kg/m  I/O last 3 completed shifts: In: 360 [P.O.:360] Out: -  No intake/output data recorded.  FHT:  FHR: 150 bpm, variability: moderate,  accelerations:  Present,  decelerations:  Absent UC:   regular, every 5-6 minutes SVE:   Dilation: 6 Effacement (%): 70 Station: -1 Exam by:: Dr. Truddie Cocoell  Labs: Lab Results  Component Value Date   WBC 7.5 07/13/2018   HGB 12.8 07/13/2018   HCT 38.2 07/13/2018   MCV 77.5 (L) 07/13/2018   PLT 249 07/13/2018    Assessment / Plan: Spontaneous labor, progressing normally.   Labor: Progressing normally Preeclampsia:  no signs or symptoms of toxicity Fetal Wellbeing:  Category I ROM: intact Pain Control:  Getting epidural I/D:  GBS pos -- pcn on board Anticipated MOD:  NSVD   Misty SchatzPatricia Sha Burling, DO Family Medicine, PGY-3

## 2018-07-13 NOTE — H&P (Signed)
OBSTETRIC ADMISSION HISTORY AND PHYSICAL  Misty Kelly is a 27 y.o. female G2P0010 with IUP at 7726w2d by LMP presenting for SOL following an appointment with the health department today.  She states they told her she was contracting every 4-5 minutes but she could not feel it.  They performed a cervical check and she was 5cm, so they sent her to be admitted here.  She has had minimal vaginal bleeding, no LOF, and positive fetal movement.  Her pregnancy was otherwise uncomplicated.  . She reports +FMs, No LOF, no blurry vision, headaches or peripheral edema, and RUQ pain.  She plans on breast and bottle feeding. She request condoms for birth control.  [facility]   Prenatal History/Complications: GBS positive.   Past Medical History: Past Medical History:  Diagnosis Date  . Allergy   . STD (sexually transmitted disease) 01/2014   Tx'd for Chlamydia--did have neg. test of cure  . Urinary incontinence     Past Surgical History: Past Surgical History:  Procedure Laterality Date  . BREAST BIOPSY      Obstetrical History: OB History    Gravida  2   Para  0   Term  0   Preterm  0   AB  1   Living  0     SAB  1   TAB  0   Ectopic  0   Multiple  0   Live Births              Social History: Social History   Socioeconomic History  . Marital status: Single    Spouse name: Not on file  . Number of children: Not on file  . Years of education: Not on file  . Highest education level: Not on file  Occupational History  . Not on file  Social Needs  . Financial resource strain: Not on file  . Food insecurity:    Worry: Not on file    Inability: Not on file  . Transportation needs:    Medical: Not on file    Non-medical: Not on file  Tobacco Use  . Smoking status: Never Smoker  . Smokeless tobacco: Never Used  Substance and Sexual Activity  . Alcohol use: No    Alcohol/week: 0.0 standard drinks    Frequency: Never    Comment: only  special occasions  . Drug use: No  . Sexual activity: Yes    Partners: Male    Birth control/protection: None  Lifestyle  . Physical activity:    Days per week: Not on file    Minutes per session: Not on file  . Stress: Not on file  Relationships  . Social connections:    Talks on phone: Not on file    Gets together: Not on file    Attends religious service: Not on file    Active member of club or organization: Not on file    Attends meetings of clubs or organizations: Not on file    Relationship status: Not on file  Other Topics Concern  . Not on file  Social History Narrative  . Not on file    Family History: Family History  Problem Relation Age of Onset  . Hyperlipidemia Brother   . Hypertension Brother   . Cancer Maternal Grandfather        Lung Ca  . Stroke Paternal Grandfather   . Cancer Maternal Grandmother 70        Dec Esophogeal  Ca  . Hypertension Mother     Allergies: No Known Allergies  Medications Prior to Admission  Medication Sig Dispense Refill Last Dose  . Prenatal Vit-Fe Fumarate-FA (PRENATAL MULTIVITAMIN) TABS tablet Take 1 tablet by mouth daily at 12 noon.   07/12/2018 at Unknown time  . cyclobenzaprine (FLEXERIL) 10 MG tablet Take 1 tablet (10 mg total) by mouth 2 (two) times daily as needed for muscle spasms. (Patient not taking: Reported on 07/13/2018) 20 tablet 0 Not Taking at Unknown time     Review of Systems   All systems reviewed and negative except as stated in HPI  Blood pressure 129/85, pulse 89, temperature 98.8 F (37.1 C), temperature source Oral, resp. rate 16, height 5\' 5"  (1.651 m), weight 89.4 kg, last menstrual period 08/27/2017, SpO2 98 %, unknown if currently breastfeeding. General appearance: alert, cooperative, appears stated age and no distress Lungs: clear to auscultation bilaterally Heart: regular rate and rhythm Abdomen: soft, non-tender; bowel sounds normal Extremities: Homans sign is negative, no sign of  DVT Presentation: cephalic Fetal monitoringBaseline: 130 bpm, Variability: Good {> 6 bpm), Accelerations: Reactive and Decelerations: occasional variables Uterine activityFrequency: Every 4-5 minutes and Intensity: mild Dilation: 5.5 Effacement (%): 60 Station: -2 Exam by:: [employer],. RN    Prenatal labs: ABO, Rh: --/--/B POS (08/14 1548) Antibody: NEG (08/14 1548) Rubella: Immune (01/07 0000) RPR: Nonreactive (01/07 0000)  HBsAg: Negative (01/07 0000)  HIV: Non-reactive (01/07 0000)  GBS: Positive (08/14 1639)  1 hr Glucola 90/177/109 Genetic screening  Quad neg Anatomy US normal  Prenatal Transfer Tool  Maternal Diabetes: No Genetic Screening: Normal Maternal Ultrasounds/Referrals: Normal Fetal Ultrasounds or other Referrals:  None Maternal Substance Abuse:  No Significant Maternal Medications:  None Significant Maternal Lab Results: Lab values include: Group B Strep positive  Results for orders placed or performed during the hospital encounter of 07/13/18 (from the past 24 hour(s))  CBC   Collection Time: 07/13/18  3:48 PM  Result Value Ref Range   WBC 7.5 4.0 - 10.5 K/uL   RBC 4.93 3.87 - 5.11 MIL/uL   Hemoglobin 12.8 12.0 - 15.0 g/dL   HCT 40.9 81.1 - 91.4 %   MCV 77.5 (L) 78.0 - 100.0 fL   MCH 26.0 26.0 - 34.0 pg   MCHC 33.5 30.0 - 36.0 g/dL   RDW 78.2 95.6 - 21.3 %   Platelets 249 150 - 400 K/uL  Type and screen [facility]   Collection Time: 07/13/18  3:48 PM  Result Value Ref Range   ABO/RH(D) B POS    Antibody Screen NEG    Sample Expiration      07/16/2018 Performed at [facility], [address] [place], [place] [address]   OB RESULT CONSOLE Group B Strep   Collection Time: 07/13/18  4:39 PM  Result Value Ref Range   GBS Positive     Patient Active Problem List   Diagnosis Date Noted  . Indication for care in labor or delivery 07/13/2018  . Positive GBS test 07/13/2018  . 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 Abnormal glucose affecting pregnancy 06/10/2018  . Encounter for supervision of other normal pregnancy, third trimester 06/10/2018    Assessment/Plan:  Misty Kelly is a 27 y.o. G2P0010 at [redacted]w[redacted]d here for SOL after being sent from the HD contracting q4-51m and 4.5cm dilated.  She is GBS positive.   #Labor:SOL. Expectant mgmt #Pain: Would like epidural #FWB: Cat I #ID:  GBS pos #MOF: both #  ZOX:[id number]:condoms #Circ:  n/a  Sandre Kittyaniel K Obrian Bulson, MD  07/13/2018, 8:54 PM

## 2018-07-14 ENCOUNTER — Inpatient Hospital Stay (HOSPITAL_COMMUNITY): Payer: BLUE CROSS/BLUE SHIELD | Admitting: Anesthesiology

## 2018-07-14 ENCOUNTER — Encounter (HOSPITAL_COMMUNITY): Payer: Self-pay | Admitting: Anesthesiology

## 2018-07-14 DIAGNOSIS — O99824 Streptococcus B carrier state complicating childbirth: Secondary | ICD-10-CM

## 2018-07-14 DIAGNOSIS — Z3A40 40 weeks gestation of pregnancy: Secondary | ICD-10-CM

## 2018-07-14 LAB — CBC
HCT: 29.9 % — ABNORMAL LOW (ref 36.0–46.0)
HCT: 27.8 % — ABNORMAL LOW (ref 36.0–46.0)
Hemoglobin: 10.4 g/dL — ABNORMAL LOW (ref 12.0–15.0)
Hemoglobin: 9.3 g/dL — ABNORMAL LOW (ref 12.0–15.0)
MCH: 27.2 pg (ref 26.0–34.0)
MCH: 26.3 pg (ref 26.0–34.0)
MCHC: 34.8 g/dL (ref 30.0–36.0)
MCHC: 33.5 g/dL (ref 30.0–36.0)
MCV: 78.3 fL (ref 78.0–100.0)
MCV: 78.8 fL (ref 78.0–100.0)
Platelets: 229 10*3/uL (ref 150–400)
Platelets: 206 10*3/uL (ref 150–400)
RBC: 3.82 MIL/uL — ABNORMAL LOW (ref 3.87–5.11)
RBC: 3.53 MIL/uL — ABNORMAL LOW (ref 3.87–5.11)
RDW: 14.4 % (ref 11.5–15.5)
RDW: 14.3 % (ref 11.5–15.5)
WBC: 20.4 10*3/uL — ABNORMAL HIGH (ref 4.0–10.5)
WBC: 16.3 10*3/uL — ABNORMAL HIGH (ref 4.0–10.5)

## 2018-07-14 LAB — RPR: RPR Ser Ql: NONREACTIVE

## 2018-07-14 LAB — DIC (DISSEMINATED INTRAVASCULAR COAGULATION) PANEL
Fibrinogen: 489 mg/dL — ABNORMAL HIGH (ref 210–475)
Prothrombin Time: 11.9 seconds (ref 11.4–15.2)

## 2018-07-14 LAB — DIC (DISSEMINATED INTRAVASCULAR COAGULATION)PANEL
D-Dimer, Quant: 10.28 ug/mL-FEU — ABNORMAL HIGH (ref 0.00–0.50)
INR: 0.88
Platelets: 226 10*3/uL (ref 150–400)
Smear Review: NONE SEEN
aPTT: 25 seconds (ref 24–36)

## 2018-07-14 LAB — PREPARE RBC (CROSSMATCH)

## 2018-07-14 MED ORDER — DIBUCAINE 1 % RE OINT: 1.0000 "application " | TOPICAL_OINTMENT | RECTAL | Status: DC | PRN

## 2018-07-14 MED ORDER — TERBUTALINE SULFATE 1 MG/ML IJ SOLN
0.2500 mg | Freq: Once | INTRAMUSCULAR | Status: DC | PRN
  Filled 2018-07-14: qty 1

## 2018-07-14 MED ORDER — EPHEDRINE 5 MG/ML INJ
10.0000 mg | INTRAVENOUS | Status: DC | PRN
  Filled 2018-07-14: qty 2

## 2018-07-14 MED ORDER — ACETAMINOPHEN 325 MG PO TABS
650.0000 mg | ORAL_TABLET | ORAL | Status: DC | PRN
Start: 2018-07-14 — End: 2018-07-16
  Administered 2018-07-15: 650 mg via ORAL
  Filled 2018-07-14: qty 2

## 2018-07-14 MED ORDER — OXYCODONE HCL 5 MG PO TABS
5.0000 mg | ORAL_TABLET | ORAL | Status: DC | PRN
  Administered 2018-07-15: 5 mg via ORAL
  Filled 2018-07-14: qty 1

## 2018-07-14 MED ORDER — CEFAZOLIN SODIUM-DEXTROSE 2-4 GM/100ML-% IV SOLN
2.0000 g | Freq: Three times a day (TID) | INTRAVENOUS | Status: DC
  Administered 2018-07-14 – 2018-07-15 (×3): 2 g via INTRAVENOUS
  Filled 2018-07-14 (×4): qty 100

## 2018-07-14 MED ORDER — PHENYLEPHRINE 40 MCG/ML (10ML) SYRINGE FOR IV PUSH (FOR BLOOD PRESSURE SUPPORT)
80.0000 ug | PREFILLED_SYRINGE | INTRAVENOUS | Status: DC | PRN
  Filled 2018-07-14: qty 5
  Filled 2018-07-14: qty 10

## 2018-07-14 MED ORDER — PRENATAL MULTIVITAMIN CH
1.0000 | ORAL_TABLET | Freq: Every day | ORAL | Status: DC
  Administered 2018-07-15: 1 via ORAL
  Filled 2018-07-14: qty 1

## 2018-07-14 MED ORDER — SODIUM CHLORIDE 0.9% IV SOLUTION: Freq: Once | INTRAVENOUS | Status: DC

## 2018-07-14 MED ORDER — DIPHENHYDRAMINE HCL 25 MG PO CAPS: 25.0000 mg | ORAL_CAPSULE | Freq: Four times a day (QID) | ORAL | Status: DC | PRN

## 2018-07-14 MED ORDER — DIPHENHYDRAMINE HCL 50 MG/ML IJ SOLN: 12.5000 mg | INTRAMUSCULAR | Status: DC | PRN

## 2018-07-14 MED ORDER — SIMETHICONE 80 MG PO CHEW: 80.0000 mg | CHEWABLE_TABLET | ORAL | Status: DC | PRN

## 2018-07-14 MED ORDER — OXYCODONE HCL 5 MG PO TABS: 10.0000 mg | ORAL_TABLET | ORAL | Status: DC | PRN

## 2018-07-14 MED ORDER — FENTANYL 2.5 MCG/ML BUPIVACAINE 1/10 % EPIDURAL INFUSION (WH - ANES)
14.0000 mL/h | INTRAMUSCULAR | Status: DC | PRN
  Administered 2018-07-14 (×2): 14 mL/h via EPIDURAL
  Filled 2018-07-14 (×2): qty 100

## 2018-07-14 MED ORDER — LACTATED RINGERS IV SOLN
INTRAVENOUS | Status: DC
  Administered 2018-07-14: 18:00:00 via INTRAVENOUS

## 2018-07-14 MED ORDER — COCONUT OIL OIL: 1.0000 "application " | TOPICAL_OIL | Status: DC | PRN

## 2018-07-14 MED ORDER — BENZOCAINE-MENTHOL 20-0.5 % EX AERO
1.0000 "application " | INHALATION_SPRAY | CUTANEOUS | Status: DC | PRN
  Administered 2018-07-15 – 2018-07-16 (×2): 1 via TOPICAL
  Filled 2018-07-14 (×2): qty 56

## 2018-07-14 MED ORDER — LIDOCAINE HCL (PF) 1 % IJ SOLN
INTRAMUSCULAR | Status: DC | PRN
  Administered 2018-07-14 (×2): 6 mL via EPIDURAL

## 2018-07-14 MED ORDER — SENNOSIDES-DOCUSATE SODIUM 8.6-50 MG PO TABS
2.0000 | ORAL_TABLET | ORAL | Status: DC
  Administered 2018-07-15: 2 via ORAL
  Filled 2018-07-14 (×2): qty 2

## 2018-07-14 MED ORDER — LACTATED RINGERS IV BOLUS
1000.0000 mL | Freq: Once | INTRAVENOUS | Status: AC
  Administered 2018-07-14: 1000 mL via INTRAVENOUS

## 2018-07-14 MED ORDER — ONDANSETRON HCL 4 MG PO TABS: 4.0000 mg | ORAL_TABLET | ORAL | Status: DC | PRN

## 2018-07-14 MED ORDER — LACTATED RINGERS IV SOLN: INTRAVENOUS | Status: DC

## 2018-07-14 MED ORDER — WITCH HAZEL-GLYCERIN EX PADS
1.0000 "application " | MEDICATED_PAD | CUTANEOUS | Status: DC | PRN
  Administered 2018-07-15: 1 via TOPICAL

## 2018-07-14 MED ORDER — PHENYLEPHRINE 40 MCG/ML (10ML) SYRINGE FOR IV PUSH (FOR BLOOD PRESSURE SUPPORT)
80.0000 ug | PREFILLED_SYRINGE | INTRAVENOUS | Status: DC | PRN
  Filled 2018-07-14: qty 5

## 2018-07-14 MED ORDER — ONDANSETRON HCL 4 MG/2ML IJ SOLN: 4.0000 mg | INTRAMUSCULAR | Status: DC | PRN

## 2018-07-14 MED ORDER — ZOLPIDEM TARTRATE 5 MG PO TABS: 5.0000 mg | ORAL_TABLET | Freq: Every evening | ORAL | Status: DC | PRN

## 2018-07-14 MED ORDER — OXYTOCIN 40 UNITS IN LACTATED RINGERS INFUSION - SIMPLE MED
1.0000 m[IU]/min | INTRAVENOUS | Status: DC
  Administered 2018-07-14: 2 m[IU]/min via INTRAVENOUS
  Filled 2018-07-14: qty 1000

## 2018-07-14 MED ORDER — LACTATED RINGERS IV SOLN
500.0000 mL | Freq: Once | INTRAVENOUS | Status: AC
  Administered 2018-07-14: 500 mL via INTRAVENOUS

## 2018-07-14 MED ORDER — IBUPROFEN 600 MG PO TABS
600.0000 mg | ORAL_TABLET | Freq: Four times a day (QID) | ORAL | Status: DC
  Administered 2018-07-15 – 2018-07-16 (×5): 600 mg via ORAL
  Filled 2018-07-14 (×6): qty 1

## 2018-07-14 NOTE — Anesthesia Preprocedure Evaluation (Signed)
Anesthesia Evaluation  Patient identified by MRN, date of birth, ID band Patient awake    Reviewed: Allergy & Precautions, H&P , NPO status , Patient's Chart, lab work & pertinent test results  Airway Mallampati: II  TM Distance: >3 FB Neck ROM: full    Dental no notable dental hx. (+) Teeth Intact   Pulmonary neg pulmonary ROS,    Pulmonary exam normal breath sounds clear to auscultation       Cardiovascular negative cardio ROS Normal cardiovascular exam Rhythm:regular Rate:Normal     Neuro/Psych negative neurological ROS  negative psych ROS   GI/Hepatic negative GI ROS, Neg liver ROS,   Endo/Other  negative endocrine ROS  Renal/GU negative Renal ROS  negative genitourinary   Musculoskeletal negative musculoskeletal ROS (+)   Abdominal (+) + obese,   Peds  Hematology negative hematology ROS (+)   Anesthesia Other Findings   Reproductive/Obstetrics (+) Pregnancy                             Anesthesia Physical Anesthesia Plan  ASA: II  Anesthesia Plan: Epidural   Post-op Pain Management:    Induction:   PONV Risk Score and Plan:   Airway Management Planned:   Additional Equipment:   Intra-op Plan:   Post-operative Plan:   Informed Consent: I have reviewed the patients History and Physical, chart, labs and discussed the procedure including the risks, benefits and alternatives for the proposed anesthesia with the patient or authorized representative who has indicated his/her understanding and acceptance.       Plan Discussed with:   Anesthesia Plan Comments:         Anesthesia Quick Evaluation  

## 2018-07-14 NOTE — Anesthesia Pain Management Evaluation Note (Signed)
  CRNA Pain Management Visit Note  Patient: Misty Kelly, 27 y.o., female  "Hello I am a member of the anesthesia team at [facility]. We have an anesthesia team available at all times to provide care throughout the hospital, including epidural management and anesthesia for C-section. I don't know your plan for the delivery whether it a natural birth, water birth, IV sedation, nitrous supplementation, doula or epidural, but we want to meet your pain goals."   1.Was your pain managed to your expectations on prior hospitalizations?   No prior hospitalizations  2.What is your expectation for pain management during this hospitalization?     Epidural  3.How can we help you reach that goal? epidural  Record the patient's initial score and the patient's pain goal.   Pain: 0  Pain Goal: 4 [facility] wants you to be able to say your pain was always managed very well.  Misty Kelly 07/14/2018

## 2018-07-14 NOTE — Progress Notes (Signed)
Dr. Erin FullingHarraway-Smith at bedside to assess patient and review lab results. Hemoglobin and hematocrit are at acceptable levels, therefore Dr. Erin FullingHarraway-Smith says do not transfuse RBCs at this point. CBC will be redrawn in AM.

## 2018-07-14 NOTE — Progress Notes (Signed)
Patient ID: Misty Kelly, female   DOB: 1991-02-18, 27 y.o.   MRN: [id number]  Patient feeling a little 'light headed'.  Does not feel like she's going to pass out and does not feel nauseous.  She believes it's because she hasn't eaten.  Her BP's have been close to her baseline, (100-10 systolic).  She has an anterior lip and is +1.  Does not feel need to push in between contractions.    Will continue to let patient labor down.   FHT: 125/moderate/accels/no decels.   BP 107/60   Pulse 89   Temp (!) 97.4 F (36.3 C) (Oral)   Resp 18   Ht 5\' 5"  (1.651 m)   Wt 89.4 kg   LMP 08/27/2017   SpO2 98%   BMI 32.78 kg/m   Dilation: Lip/rim Effacement (%): 90 Cervical Position: Anterior Station: Plus 1 Presentation: Vertex Exam by:: Misty Kelly

## 2018-07-14 NOTE — Anesthesia Procedure Notes (Signed)
Epidural Patient location during procedure: OB Start time: 07/14/2018 3:40 AM End time: 07/14/2018 3:44 AM  Staffing Anesthesiologist: Leilani AbleHatchett, Ebelin Dillehay, MD Performed: anesthesiologist   Preanesthetic Checklist Completed: patient identified, site marked, surgical consent, pre-op evaluation, timeout performed, IV checked, risks and benefits discussed and monitors and equipment checked  Epidural Patient position: sitting Prep: site prepped and draped and DuraPrep Patient monitoring: continuous pulse ox and blood pressure Approach: midline Location: L3-L4 Injection technique: LOR air  Needle:  Needle type: Tuohy  Needle gauge: 17 G Needle length: 9 cm and 9 Needle insertion depth: 5 cm cm Catheter type: closed end flexible Catheter size: 19 Gauge Catheter at skin depth: 10 cm Test dose: negative and Other  Assessment Sensory level: T9 Events: blood not aspirated, injection not painful, no injection resistance, negative IV test and no paresthesia  Additional Notes Reason for block:procedure for pain

## 2018-07-14 NOTE — Progress Notes (Signed)
Labor Progress Note Omar Personrush Beverley is a [age] y.o. G2P0010 at 7853w3d presented for labor  S:  Comfortable with epidural, no c/o.   O:  BP 113/74   Pulse 71   Temp 98.2 F (36.8 C) (Oral)   Resp 20   Ht 5\' 5"  (1.651 m)   Wt 89.4 kg   LMP 08/27/2017   SpO2 98%   BMI 32.78 kg/m  EFM: baseline 125 bpm/ mod variability/ + accels/ no decels  Toco: q4 SVE: Dilation: 8 Effacement (%): 90 Cervical Position: Middle Station: -1 Presentation: Vertex Exam by:: Henderson NewcomerStephanie Faulk Pitocin: 4 mu/min  A/P: 27 y.o. G2P0010 1153w3d  1. Labor: active phase 2. FWB: Cat I 3. Pain: epidural  Continue Pitocin. Anticipate SVD.  Donette LarryMelanie Mischa Pollard, CNM 9:43 AM

## 2018-07-15 ENCOUNTER — Encounter (HOSPITAL_COMMUNITY): Payer: Self-pay | Admitting: *Deleted

## 2018-07-15 LAB — CBC
HCT: 24.5 % — ABNORMAL LOW (ref 36.0–46.0)
Hemoglobin: 8.4 g/dL — ABNORMAL LOW (ref 12.0–15.0)
MCH: 26.5 pg (ref 26.0–34.0)
MCHC: 34.3 g/dL (ref 30.0–36.0)
MCV: 77.3 fL — ABNORMAL LOW (ref 78.0–100.0)
Platelets: 189 10*3/uL (ref 150–400)
RBC: 3.17 MIL/uL — ABNORMAL LOW (ref 3.87–5.11)
RDW: 14.3 % (ref 11.5–15.5)
WBC: 14 10*3/uL — ABNORMAL HIGH (ref 4.0–10.5)

## 2018-07-15 NOTE — Lactation Note (Signed)
This note was copied from a baby's chart. Lactation Consultation Note  Patient Name: Misty Kelly ZOXWR'UToday's Date: 07/15/2018 Reason for consult: Follow-up assessment Mom called out for latch assist.  Baby awake and crying.  Positioned baby in the football hold skin to skin.  Hand expression done but not milk seen.  Baby latches on easily but pulls off and on due to flow not as fast as bottles.  Positioned baby on opposite breast and she continued to pull off fussy.  Mom will continue to feed with cues and post pump with symphony pump.  Instructed to call for assist prn.  Maternal Data Has patient been taught Hand Expression?: Yes Does the patient have breastfeeding experience prior to this delivery?: No  Feeding Feeding Type: Breast Fed Nipple Type: Slow - flow Length of feed: 10 min  LATCH Score Latch: Repeated attempts needed to sustain latch, nipple held in mouth throughout feeding, stimulation needed to elicit sucking reflex.  Audible Swallowing: None  Type of Nipple: Everted at rest and after stimulation  Comfort (Breast/Nipple): Soft / non-tender  Hold (Positioning): Assistance needed to correctly position infant at breast and maintain latch.  LATCH Score: 6  Interventions Interventions: Assisted with latch;Breast compression;Skin to skin;Adjust position;Breast massage;Support pillows;Hand express;DEBP  Lactation Tools Discussed/Used Pump Review: Setup, frequency, and cleaning Initiated by:: LM Date initiated:: 07/15/18   Consult Status Consult Status: Follow-up Date: 07/16/18 Follow-up type: In-patient    Huston FoleyMOULDEN, Francella Barnett S 07/15/2018, 1:04 PM

## 2018-07-15 NOTE — Anesthesia Postprocedure Evaluation (Signed)
Anesthesia Post Note  Patient: Shailee Uttech Omar Person Procedure(s) Performed: AN AD HOC LABOR EPIDURAL     Patient location during evaluation: Mother Baby Anesthesia Type: Epidural Level of consciousness: awake and alert Pain management: pain level controlled Vital Signs Assessment: post-procedure vital signs reviewed and stable Respiratory status: spontaneous breathing, nonlabored ventilation and respiratory function stable Cardiovascular status: stable Postop Assessment: no headache, no backache, epidural receding and patient able to bend at knees Anesthetic complications: no    Last Vitals:  Vitals:   07/14/18 2350 07/15/18 0510  BP: 121/68 121/69  Pulse: (!) 102 98  Resp: 16 16  Temp: 37.8 C 36.9 C  SpO2: 99%     Last Pain:  Vitals:   07/15/18 0510  TempSrc: Oral  PainSc: 3    Pain Goal:                 Rica RecordsICKELTON,Sharlet Notaro

## 2018-07-15 NOTE — Progress Notes (Signed)
Post Partum Day 1 Subjective: no complaints tolerating PO, small lochia, plans to breastfeed, condoms  Objective: Blood pressure 116/70, pulse 88, temperature 98.1 F (36.7 C), temperature source Oral, resp. rate 18, height 5\' 5"  (1.651 m), weight 89.4 kg, last menstrual period 08/27/2017, SpO2 97 %, unknown if currently breastfeeding.  Physical Exam:  General: alert, cooperative and no distress Lochia:normal flow Chest: CTAB Heart: RRR no m/r/g Abdomen: +BS, soft, nontender,  Uterine Fundus: firm DVT Evaluation: No evidence of DVT seen on physical exam. Extremities: no edema  Recent Labs    07/14/18 2032 07/15/18 0529  HGB 9.3* 8.4*  HCT 27.8* 24.5*   Output excellent last night, IVF were dc'd.  Vaginal packing removed w/only small amount of blood on peripad, no bleeding behind packing.  Vulva edematous, no sx of hematoma.   Assessment/Plan: PPD#1, S/P >2L blood from laceration.  Stable. Pt counseled about possible need for blood transfusion if she has symptomatic anemia today.  Will see how she feels after getting up and moving about some.    LOS: 2 days   Jacklyn ShellFrances Cresenzo-Dishmon 07/15/2018, 3:20 PM

## 2018-07-15 NOTE — Progress Notes (Signed)
CNM called to review case and plan of care for overnight. Pt has low grade fever of 100.1, per Drenda FreezeFran, CNM, ok for pt to receive no medication (as pt doesn't want any) since pt is already on IV antibiotics. Pt has foley catheter in place and is emptying large quantities of clear yellow urine. Drenda FreezeFran ok'd IV to be saline locked, however wants the foley to stay in place as to not disrupt the vaginal packing and sandbag pt has in place. Pt remains asymptomatic and has another CBC ordered for 0500.

## 2018-07-15 NOTE — Lactation Note (Signed)
This note was copied from a baby's chart. Lactation Consultation Note  Patient Name: Girl Omar Personrush Gisler ZOXWR'UToday's Date: 07/15/2018 Reason for consult: Initial assessment;1st time breastfeeding;Term;[facility] Breast[facility] consultation services and support information given and reviewed.  Baby is 6019 hours old and has not latched.  Mom is formula feeding per choice.  Baby just finished a feeding.  Symphony pump set up and initiated.  Mom will call for assist with feeding cues and pump every 3 hours if baby isn't latching.  Instructed on hand expression but no milk seen.  Maternal Data Has patient been taught Hand Expression?: Yes Does the patient have breastfeeding experience prior to this delivery?: No  Feeding Feeding Type: Bottle Fed - Formula Nipple Type: Slow - flow  LATCH Score                   Interventions    Lactation Tools Discussed/Used Pump Review: Setup, frequency, and cleaning Initiated by:: LM Date initiated:: 07/15/18   Consult Status Consult Status: Follow-up Date: 07/16/18 Follow-up type: In-patient    Huston FoleyMOULDEN, Ramez Arrona S 07/15/2018, 11:25 AM

## 2018-07-16 DIAGNOSIS — O9081 Anemia of the puerperium: Secondary | ICD-10-CM | POA: Diagnosis not present

## 2018-07-16 LAB — TYPE AND SCREEN
ABO/RH(D): B POS
Antibody Screen: NEGATIVE
Unit division: 0
Unit division: 0
Unit division: 0
Unit division: 0

## 2018-07-16 LAB — BPAM RBC
Blood Product Expiration Date: 201909072359
Blood Product Expiration Date: 201909082359
Blood Product Expiration Date: 201909232359
Blood Product Expiration Date: 201909232359
ISSUE DATE / TIME: 201908162340
ISSUE DATE / TIME: 201908170217
Unit Type and Rh: 5100
Unit Type and Rh: 5100
Unit Type and Rh: 5100
Unit Type and Rh: 5100

## 2018-07-16 MED ORDER — FERROUS FUMARATE 324 (106 FE) MG PO TABS
1.0000 | ORAL_TABLET | Freq: Two times a day (BID) | ORAL | Status: DC
  Administered 2018-07-16: 106 mg via ORAL
  Filled 2018-07-16: qty 1

## 2018-07-16 MED ORDER — IBUPROFEN 600 MG PO TABS: 600.0000 mg | ORAL_TABLET | Freq: Four times a day (QID) | ORAL | 0 refills | Status: DC | PRN

## 2018-07-16 MED ORDER — FERROUS FUMARATE 324 (106 FE) MG PO TABS: 1.0000 | ORAL_TABLET | Freq: Two times a day (BID) | ORAL | 3 refills | Status: DC

## 2018-07-16 NOTE — Lactation Note (Signed)
This note was copied from a baby'Kelly chart. Lactation Consultation Note  Patient Name: Misty Kelly ZOXWR'UToday'Kelly Date: 07/16/2018  Mom states baby does not sustain a latch.  She was recently fed formula and is sleeping in crib.  Reviewed pump options and mom would like to use manual pump.  Instructed on use.  Discussed milk coming to volume and the prevention and treatment of engorgement.   Lactation outpatient services and support encouraged and recommended   Maternal Data    Feeding    LATCH Score                   Interventions    Lactation Tools Discussed/Used     Consult Status      Misty Kelly, Misty Kelly 07/16/2018, 9:34 AM

## 2018-07-16 NOTE — Discharge Instructions (Signed)
Vaginal Delivery, Care After °Refer to this sheet in the next few weeks. These instructions provide you with information about caring for yourself after vaginal delivery. Your health care provider may also give you more specific instructions. Your treatment has been planned according to current medical practices, but problems sometimes occur. Call your health care provider if you have any problems or questions. °What can I expect after the procedure? °After vaginal delivery, it is common to have: °· Some bleeding from your vagina. °· Soreness in your abdomen, your vagina, and the area of skin between your vaginal opening and your anus (perineum). °· Pelvic cramps. °· Fatigue. ° °Follow these instructions at home: °Medicines °· Take over-the-counter and prescription medicines only as told by your health care provider. °· If you were prescribed an antibiotic medicine, take it as told by your health care provider. Do not stop taking the antibiotic until it is finished. °Driving ° °· Do not drive or operate heavy machinery while taking prescription pain medicine. °· Do not drive for 24 hours if you received a sedative. °Lifestyle °· Do not drink alcohol. This is especially important if you are breastfeeding or taking medicine to relieve pain. °· Do not use tobacco products, including cigarettes, chewing tobacco, or e-cigarettes. If you need help quitting, ask your health care provider. °Eating and drinking °· Drink at least 8 eight-ounce glasses of water every day unless you are told not to by your health care provider. If you choose to breastfeed your baby, you may need to drink more water than this. °· Eat high-fiber foods every day. These foods may help prevent or relieve constipation. High-fiber foods include: °? Whole grain cereals and breads. °? Brown rice. °? Beans. °? Fresh fruits and vegetables. °Activity °· Return to your normal activities as told by your health care provider. Ask your health care provider  what activities are safe for you. °· Rest as much as possible. Try to rest or take a nap when your baby is sleeping. °· Do not lift anything that is heavier than your baby or 10 lb (4.5 kg) until your health care provider says that it is safe. °· Talk with your health care provider about when you can engage in sexual activity. This may depend on your: °? Risk of infection. °? Rate of healing. °? Comfort and desire to engage in sexual activity. °Vaginal Care °· If you have an episiotomy or a vaginal tear, check the area every day for signs of infection. Check for: °? More redness, swelling, or pain. °? More fluid or blood. °? Warmth. °? Pus or a bad smell. °· Do not use tampons or douches until your health care provider says this is safe. °· Watch for any blood clots that may pass from your vagina. These may look like clumps of dark red, brown, or black discharge. °General instructions °· Keep your perineum clean and dry as told by your health care provider. °· Wear loose, comfortable clothing. °· Wipe from front to back when you use the toilet. °· Ask your health care provider if you can shower or take a bath. If you had an episiotomy or a perineal tear during labor and delivery, your health care provider may tell you not to take baths for a certain length of time. °· Wear a bra that supports your breasts and fits you well. °· If possible, have someone help you with household activities and help care for your baby for at least a few days after   you leave the hospital. °· Keep all follow-up visits for you and your baby as told by your health care provider. This is important. °Contact a health care provider if: °· You have: °? Vaginal discharge that has a bad smell. °? Difficulty urinating. °? Pain when urinating. °? A sudden increase or decrease in the frequency of your bowel movements. °? More redness, swelling, or pain around your episiotomy or vaginal tear. °? More fluid or blood coming from your episiotomy or  vaginal tear. °? Pus or a bad smell coming from your episiotomy or vaginal tear. °? A fever. °? A rash. °? Little or no interest in activities you used to enjoy. °? Questions about caring for yourself or your baby. °· Your episiotomy or vaginal tear feels warm to the touch. °· Your episiotomy or vaginal tear is separating or does not appear to be healing. °· Your breasts are painful, hard, or turn red. °· You feel unusually sad or worried. °· You feel nauseous or you vomit. °· You pass large blood clots from your vagina. If you pass a blood clot from your vagina, save it to show to your health care provider. Do not flush blood clots down the toilet without having your health care provider look at them. °· You urinate more than usual. °· You are dizzy or light-headed. °· You have not breastfed at all and you have not had a menstrual period for 12 weeks after delivery. °· You have stopped breastfeeding and you have not had a menstrual period for 12 weeks after you stopped breastfeeding. °Get help right away if: °· You have: °? Pain that does not go away or does not get better with medicine. °? Chest pain. °? Difficulty breathing. °? Blurred vision or spots in your vision. °? Thoughts about hurting yourself or your baby. °· You develop pain in your abdomen or in one of your legs. °· You develop a severe headache. °· You faint. °· You bleed from your vagina so much that you fill two sanitary pads in one hour. °This information is not intended to replace advice given to you by your health care provider. Make sure you discuss any questions you have with your health care provider. °Document Released: 11/13/2000 Document Revised: 04/29/2016 Document Reviewed: 12/01/2015 °[employer]vier Interactive Patient Education © 2018 [employer] ° °

## 2018-07-16 NOTE — Discharge Summary (Signed)
OB Discharge Summary     Patient Name: Misty Kelly DOB: 1990/12/19 MRN: [id number]  Date of admission: 07/13/2018 Delivering MD: Tamera StandsWALLACE, LAUREL S   Date of discharge: 07/16/2018  Admitting diagnosis: 40.3WKS LABOR Intrauterine pregnancy: 1867w3d     Secondary diagnosis:  Principal Problem:   Indication for care in labor or delivery Active Problems:   Abnormal glucose affecting pregnancy   Positive GBS test   Hgb E-beta thalassemia (HCC)   Vaginal delivery   Postpartum hemorrhage   Postpartum anemia   Obstetric vaginal laceration with type 3b third degree perineal laceration  Additional problems: none     Discharge diagnosis: Term Pregnancy Delivered                                                                                                Post partum procedures:none  Augmentation: AROM and Pitocin  Complications: Hemorrhage>104500mL  Hospital course:  Onset of Labor With Vaginal Delivery     27 y.o. yo G2P1011 at 6967w3d was admitted in Latent Labor on 07/13/2018. Patient had a labor course remarkable for becoming complete after requiring Pit/AROM for augmentation. She had a complicated 3rd degree perineal repair requiring packing afterwards due to oozing. Initially pt was thought to benefit from a blood tx, but then the decision was to hold that.  Membrane Rupture Time/Date: 6:47 AM ,07/14/2018   Intrapartum Procedures: Episiotomy: None [1]                                         Lacerations:  3rd degree [4]  Patient had a delivery of a Viable infant. 07/14/2018  Information for the patient's newborn:  Ricki Millerang, Girl Jamita [[id number]][[id number]]       On PPD#1 her hgb was 8.4 from 12.8 on admission. She denied any difficulty with ambulation. The vag packing was removed and the lac repair was hemostatic and stable. Fe was started. She is ambulating, tolerating a regular diet, passing flatus, and urinating well. Patient is discharged home in stable condition on 07/16/18.   Physical exam   Vitals:   07/15/18 1000 07/15/18 1518 07/15/18 2309 07/16/18 0519  BP: 109/60 116/70 123/85 (!) 101/59  Pulse: 84 88 99 (!) 104  Resp: 18 18  18   Temp: 97.9 F (36.6 C) 98.1 F (36.7 C) 98 F (36.7 C) 98.2 F (36.8 C)  TempSrc: Oral Oral Oral Oral  SpO2:  97% 100%   Weight:      Height:       General: alert and cooperative Lochia: appropriate Uterine Fundus: firm Incision: N/A DVT Evaluation: No evidence of DVT seen on physical exam. Labs: Lab Results  Component Value Date   WBC 14.0 (H) 07/15/2018   HGB 8.4 (L) 07/15/2018   HCT 24.5 (L) 07/15/2018   MCV 77.3 (L) 07/15/2018   PLT 189 07/15/2018   No flowsheet data found.  Discharge instruction: per After Visit Summary and "Baby and Me Booklet".  After visit meds:  Allergies as of 07/16/2018  No Known Allergies     Medication List    STOP taking these medications   cyclobenzaprine 10 MG tablet Commonly known as:  FLEXERIL     TAKE these medications   Ferrous Fumarate 324 (106 Fe) MG Tabs tablet Commonly known as:  HEMOCYTE - 106 mg FE Take 1 tablet (106 mg of iron total) by mouth 2 (two) times daily.   ibuprofen 600 MG tablet Commonly known as:  ADVIL,MOTRIN Take 1 tablet (600 mg total) by mouth every 6 (six) hours as needed.   prenatal multivitamin Tabs tablet Take 1 tablet by mouth daily at 12 noon.       Diet: routine diet  Activity: Advance as tolerated. Pelvic rest for 6 weeks.   Outpatient follow up:4 weeks Follow up Appt:No future appointments. Follow up Visit:No follow-ups on file.  Postpartum contraception: Condoms  Newborn Data: Live born female  Birth Weight: 8 lb 4 oz (3742 g) APGAR: 9, 9  Newborn Delivery   Birth date/time:  07/14/2018 15:50:00 Delivery type:  Vaginal, Spontaneous     Baby Feeding: Bottle and Breast Disposition:home with mother   07/16/2018 Cam HaiSHAW, Anetha Slagel, CNM  9:29 AM

## 2018-10-02 ENCOUNTER — Ambulatory Visit (HOSPITAL_COMMUNITY)
Admission: EM | Admit: 2018-10-02 | Discharge: 2018-10-02 | Disposition: A | Discharge status: Home / Self Care | Payer: BLUE CROSS/BLUE SHIELD | Attending: Family Medicine | Admitting: Family Medicine

## 2018-10-02 ENCOUNTER — Other Ambulatory Visit: Payer: Self-pay

## 2018-10-02 ENCOUNTER — Encounter (HOSPITAL_COMMUNITY): Payer: Self-pay

## 2018-10-02 DIAGNOSIS — K1120 Sialoadenitis, unspecified: Secondary | ICD-10-CM | POA: Diagnosis not present

## 2018-10-02 MED ORDER — AMOXICILLIN-POT CLAVULANATE 875-125 MG PO TABS: 1.0000 | ORAL_TABLET | Freq: Two times a day (BID) | ORAL | 0 refills | Status: DC

## 2018-10-02 NOTE — Discharge Instructions (Signed)
Symptoms appear related to blockage of salivary gland.  Increase the amount of water you are drinking.  Sucking on sour candies can be helpful as well.  If no improvement or if worsening please start antibiotics tomorrow.  Massage to the cheek/ swollen area can also be helpful.  Tylenol or ibuprofen for pain control. If symptoms worsen or do not improve in the next week to return to be seen or to follow up with your PCP or with ENT.

## 2018-10-02 NOTE — ED Provider Notes (Signed)
[facility]    CSN: [id number] Arrival date & time: 10/02/18  1633     History   Chief Complaint No chief complaint on file.   HPI Misty Kelly is a 27 y.o. female.   Misty Kelly presents with complaints of sore throat, pain and swelling to right side of face and inside cheek. Started last night and worse today. No known fevers. No cough or runny nose. Took tylenol which didn't help. Pain with swallowing. Didn't bit side of cheek at all. Denies  Previous similar. No known ill contacts. No gi/gu complaints. Without contributing medical history.      ROS per HPI.      Past Medical History:  Diagnosis Date  . Allergy   . STD (sexually transmitted disease) 01/2014   Tx'd for Chlamydia--did have neg. test of cure  . Urinary incontinence     Patient Active Problem List   Diagnosis Date Noted  . Postpartum anemia 07/16/2018  . Obstetric vaginal laceration with type 3b third degree perineal laceration 07/16/2018  . Vaginal delivery 07/14/2018  . Postpartum hemorrhage 07/14/2018  . Indication for care in labor or delivery 07/13/2018  . Positive GBS test 07/13/2018  . 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 Abnormal glucose affecting pregnancy 06/10/2018  . Encounter for supervision of other normal pregnancy, third trimester 06/10/2018    Past Surgical History:  Procedure Laterality Date  . BREAST BIOPSY      OB History    Gravida  2   Para  1   Term  1   Preterm  0   AB  1   Living  1     SAB  1   TAB  0   Ectopic  0   Multiple  0   Live Births  1            Home Medications    Prior to Admission medications   Medication Sig Start Date End Date Taking? Authorizing Provider  amoxicillin-clavulanate (AUGMENTIN) 875-125 MG tablet Take 1 tablet by mouth every 12 (twelve) hours for 10 days. 10/02/18 10/12/18  Georgetta Haber, NP  Ferrous Fumarate (HEMOCYTE - 106 MG FE) 324 (106 Fe) MG TABS tablet Take 1 tablet (106 mg of iron total) by  mouth 2 (two) times daily. 07/16/18   Arabella Merles, CNM  ibuprofen (ADVIL,MOTRIN) 600 MG tablet Take 1 tablet (600 mg total) by mouth every 6 (six) hours as needed. 07/16/18   Arabella Merles, CNM  Prenatal Vit-Fe Fumarate-FA (PRENATAL MULTIVITAMIN) TABS tablet Take 1 tablet by mouth daily at 12 noon.    [provider]    Family History Family History  Problem Relation Age of Onset  . Hyperlipidemia Brother   . Hypertension Brother   . Cancer Maternal Grandfather        Lung Ca  . Stroke Paternal Grandfather   . Cancer Maternal Grandmother 38        Dec Esophogeal Ca  . Hypertension Mother     Social History Social History   Tobacco Use  . Smoking status: Never Smoker  . Smokeless tobacco: Never Used  Substance Use Topics  . Alcohol use: No    Alcohol/week: 0.0 standard drinks    Frequency: Never    Comment: only special occasions  . Drug use: No     Allergies   Patient has no known allergies.   Review of Systems Review of Systems   Physical Exam Triage Vital Signs ED  Triage Vitals  Enc Vitals Group     BP 10/02/18 1712 (!) 125/95     Pulse Rate 10/02/18 1712 88     Resp 10/02/18 1712 18     Temp 10/02/18 1712 97.8 F (36.6 C)     Temp Source 10/02/18 1712 Oral     SpO2 10/02/18 1712 100 %     Weight 10/02/18 1711 175 lb (79.4 kg)     Height --      Head Circumference --      Peak Flow --      Pain Score 10/02/18 1714 8     Pain Loc --      Pain Edu? --      Excl. in GC? --    No data found.  Updated Vital Signs BP (!) 125/95 (BP Location: Right Arm)   Pulse 88   Temp 97.8 F (36.6 C) (Oral)   Resp 18   Wt 175 lb (79.4 kg)   LMP 09/27/2018   SpO2 100%   BMI 29.12 kg/m   Visual Acuity Right Eye Distance:   Left Eye Distance:   Bilateral Distance:    Right Eye Near:   Left Eye Near:    Bilateral Near:     Physical Exam  Constitutional: She is oriented to person, place, and time. She appears well-developed and  well-nourished. No distress.  HENT:  Head: Normocephalic and atraumatic.  Right Ear: Tympanic membrane, external ear and ear canal normal.  Left Ear: Tympanic membrane, external ear and ear canal normal.  Nose: Nose normal.  Mouth/Throat: Uvula is midline and mucous membranes are normal. Posterior oropharyngeal erythema present. No tonsillar abscesses. Tonsils are 1+ on the right. Tonsils are 1+ on the left. No tonsillar exudate.  Swelling to right buccal mucosa with raised white lesion noted; tenderness; tenderness and swelling to right parotid   Eyes: Pupils are equal, round, and reactive to light. Conjunctivae and EOM are normal.  Cardiovascular: Normal rate, regular rhythm and normal heart sounds.  Pulmonary/Chest: Effort normal and breath sounds normal.  Neurological: She is alert and oriented to person, place, and time.  Skin: Skin is warm and dry.     UC Treatments / Results  Labs (all labs ordered are listed, but only abnormal results are displayed) Labs Reviewed - No data to display  EKG None  Radiology No results found.  Procedures Procedures (including critical care time)  Medications Ordered in UC Medications - No data to display  Initial Impression / Assessment and Plan / UC Course  I have reviewed the triage vital signs and the nursing notes.  Pertinent labs & imaging results that were available during my care of the patient were reviewed by me and considered in my medical decision making (see chart for details).     Concern for parotitis vs obstruction of gland. Afebrile. Mild swelling. Encouraged supportive cares tonight, if worsening of symptoms may start abx tomorrow. If symptoms worsen or do not improve in the next week to return to be seen or to follow up with PCP or ENT.  Patient verbalized understanding and agreeable to plan.    Final Clinical Impressions(s) / UC Diagnoses   Final diagnoses:  Salivary gland inflammation     Discharge Instructions      Symptoms appear related to blockage of salivary gland.  Increase the amount of water you are drinking.  Sucking on sour candies can be helpful as well.  If no improvement or if worsening please  start antibiotics tomorrow.  Massage to the cheek/ swollen area can also be helpful.  Tylenol or ibuprofen for pain control. If symptoms worsen or do not improve in the next week to return to be seen or to follow up with your PCP or with ENT.    ED Prescriptions    Medication Sig Dispense Auth. Provider   amoxicillin-clavulanate (AUGMENTIN) 875-125 MG tablet Take 1 tablet by mouth every 12 (twelve) hours for 10 days. 20 tablet Georgetta Haber, NP     Controlled Substance Prescriptions [place] Controlled Substance Registry consulted? Not Applicable   Georgetta Haber, NP 10/02/18 1753

## 2018-10-02 NOTE — ED Triage Notes (Signed)
Pt cc/ sore throat and lump in the inside of her right cheek very sore.  This started yesterday.

## 2018-10-04 ENCOUNTER — Encounter (HOSPITAL_COMMUNITY): Payer: Self-pay | Admitting: Emergency Medicine

## 2018-10-04 ENCOUNTER — Ambulatory Visit (HOSPITAL_COMMUNITY)
Admission: EM | Admit: 2018-10-04 | Discharge: 2018-10-04 | Disposition: A | Discharge status: Home / Self Care | Payer: BLUE CROSS/BLUE SHIELD | Attending: Family Medicine | Admitting: Family Medicine

## 2018-10-04 ENCOUNTER — Emergency Department (HOSPITAL_COMMUNITY): Payer: BLUE CROSS/BLUE SHIELD

## 2018-10-04 ENCOUNTER — Encounter (HOSPITAL_COMMUNITY): Payer: Self-pay | Admitting: *Deleted

## 2018-10-04 ENCOUNTER — Emergency Department (HOSPITAL_COMMUNITY)
Admission: EM | Admit: 2018-10-04 | Discharge: 2018-10-04 | Disposition: A | Discharge status: Home / Self Care | Payer: BLUE CROSS/BLUE SHIELD | Attending: Emergency Medicine | Admitting: Emergency Medicine

## 2018-10-04 DIAGNOSIS — J36 Peritonsillar abscess: Secondary | ICD-10-CM | POA: Diagnosis not present

## 2018-10-04 DIAGNOSIS — J029 Acute pharyngitis, unspecified: Secondary | ICD-10-CM | POA: Diagnosis not present

## 2018-10-04 DIAGNOSIS — Z79899 Other long term (current) drug therapy: Secondary | ICD-10-CM | POA: Insufficient documentation

## 2018-10-04 DIAGNOSIS — R1310 Dysphagia, unspecified: Secondary | ICD-10-CM

## 2018-10-04 LAB — CBC
HCT: 42 % (ref 36.0–46.0)
Hemoglobin: 12.7 g/dL (ref 12.0–15.0)
MCH: 24 pg — ABNORMAL LOW (ref 26.0–34.0)
MCHC: 30.2 g/dL (ref 30.0–36.0)
MCV: 79.4 fL — ABNORMAL LOW (ref 80.0–100.0)
Platelets: 346 10*3/uL (ref 150–400)
RBC: 5.29 MIL/uL — ABNORMAL HIGH (ref 3.87–5.11)
RDW: 12.5 % (ref 11.5–15.5)
WBC: 16.6 10*3/uL — ABNORMAL HIGH (ref 4.0–10.5)
nRBC: 0 % (ref 0.0–0.2)

## 2018-10-04 LAB — BASIC METABOLIC PANEL
Anion gap: 11 (ref 5–15)
BUN: <5 mg/dL — ABNORMAL LOW (ref 6–20)
CO2: 25 mmol/L (ref 22–32)
Calcium: 9.5 mg/dL (ref 8.9–10.3)
Chloride: 100 mmol/L (ref 98–111)
Creatinine, Ser: 0.81 mg/dL (ref 0.44–1.00)
GFR calc Af Amer: >60 mL/min (ref >60)
GFR calc non Af Amer: >60 mL/min (ref >60)
Glucose, Bld: 126 mg/dL — ABNORMAL HIGH (ref 70–99)
Potassium: 3.5 mmol/L (ref 3.5–5.1)
Sodium: 136 mmol/L (ref 135–145)

## 2018-10-04 LAB — RAPID HIV SCREEN (HIV 1/2 AB+AG)
HIV 1/2 Antibodies: NONREACTIVE
HIV-1 P24 Antigen - HIV24: NONREACTIVE

## 2018-10-04 MED ORDER — CLINDAMYCIN PHOSPHATE 600 MG/50ML IV SOLN
600.0000 mg | Freq: Once | INTRAVENOUS | Status: AC
  Administered 2018-10-04: 600 mg via INTRAVENOUS
  Filled 2018-10-04: qty 50

## 2018-10-04 MED ORDER — MAGIC MOUTHWASH
5.0000 mL | Freq: Once | ORAL | Status: AC
  Administered 2018-10-04: 5 mL via ORAL
  Filled 2018-10-04: qty 5

## 2018-10-04 MED ORDER — HYDROCODONE-ACETAMINOPHEN 7.5-325 MG/15ML PO SOLN
10.0000 mL | Freq: Once | ORAL | Status: AC
  Administered 2018-10-04: 10 mL via ORAL
  Filled 2018-10-04: qty 15

## 2018-10-04 MED ORDER — DEXAMETHASONE SODIUM PHOSPHATE 10 MG/ML IJ SOLN
10.0000 mg | Freq: Once | INTRAMUSCULAR | Status: AC
  Administered 2018-10-04: 10 mg via INTRAVENOUS
  Filled 2018-10-04: qty 1

## 2018-10-04 MED ORDER — ACETAMINOPHEN 160 MG/5ML PO SOLN
650.0000 mg | Freq: Once | ORAL | Status: AC
  Administered 2018-10-04: 650 mg via ORAL

## 2018-10-04 MED ORDER — HYDROCODONE-ACETAMINOPHEN 7.5-325 MG/15ML PO SOLN: 15.0000 mL | Freq: Four times a day (QID) | ORAL | 0 refills | Status: DC | PRN

## 2018-10-04 MED ORDER — MAGIC MOUTHWASH W/LIDOCAINE: 5.0000 mL | Freq: Three times a day (TID) | ORAL | 0 refills | Status: DC | PRN

## 2018-10-04 MED ORDER — ACETAMINOPHEN 160 MG/5ML PO SOLN
ORAL | Status: AC
  Filled 2018-10-04: qty 20.3

## 2018-10-04 MED ORDER — IOHEXOL 300 MG/ML  SOLN
75.0000 mL | Freq: Once | INTRAMUSCULAR | Status: AC | PRN
  Administered 2018-10-04: 75 mL via INTRAVENOUS

## 2018-10-04 MED ORDER — SODIUM CHLORIDE 0.9 % IV BOLUS
2000.0000 mL | Freq: Once | INTRAVENOUS | Status: AC
  Administered 2018-10-04: 1000 mL via INTRAVENOUS

## 2018-10-04 NOTE — ED Triage Notes (Signed)
Pt here for mouth ulcers and sore throat not improved with antibiotics; pt was seen here Sunday for same and is now worse

## 2018-10-04 NOTE — ED Provider Notes (Signed)
[facility] EMERGENCY DEPARTMENT Provider Note   CSN: [id number] Arrival date & time: 10/04/18  1122     History   Chief Complaint Chief Complaint  Patient presents with  . Sore Throat    HPI Misty Kelly is a 27 y.o. female.  27 year old female from urgent care for possible peritonsillar abscess.  Patient states she developed a sore throat on Saturday, went to urgent care on Sunday where she was given amoxicillin Augmentin for a possible salivary gland infection.  Patient states that she started the medication on Monday, woke up today with worsening pain and difficulty swallowing.  Patient was found to be febrile in urgent care and given Tylenol, sent to ER for further evaluation.  Patient is able to swallow her secretions but states this is very painful and unable to swallow anything else. Also reports ulcers in her mouth. No known sick contacts. No other complaints or concerns.      Past Medical History:  Diagnosis Date  . Allergy   . STD (sexually transmitted disease) 01/2014   Tx'd for Chlamydia--did have neg. test of cure  . Urinary incontinence     Patient Active Problem List   Diagnosis Date Noted  . Postpartum anemia 07/16/2018  . Obstetric vaginal laceration with type 3b third degree perineal laceration 07/16/2018  . Vaginal delivery 07/14/2018  . Postpartum hemorrhage 07/14/2018  . Indication for care in labor or delivery 07/13/2018  . Positive GBS test 07/13/2018  . 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 Abnormal glucose affecting pregnancy 06/10/2018  . Encounter for supervision of other normal pregnancy, third trimester 06/10/2018    Past Surgical History:  Procedure Laterality Date  . BREAST BIOPSY       OB History    Gravida  2   Para  1   Term  1   Preterm  0   AB  1   Living  1     SAB  1   TAB  0   Ectopic  0   Multiple  0   Live Births  1            Home Medications    Prior to Admission  medications   Medication Sig Start Date End Date Taking? Authorizing Provider  amoxicillin-clavulanate (AUGMENTIN) 875-125 MG tablet Take 1 tablet by mouth every 12 (twelve) hours for 10 days. 10/02/18 10/12/18  Georgetta Haber, NP  Ferrous Fumarate (HEMOCYTE - 106 MG FE) 324 (106 Fe) MG TABS tablet Take 1 tablet (106 mg of iron total) by mouth 2 (two) times daily. 07/16/18   Arabella Merles, CNM  HYDROcodone-acetaminophen (HYCET) 7.5-325 mg/15 ml solution Take 15 mLs by mouth 4 (four) times daily as needed for moderate pain. 10/04/18 10/04/19  Jeannie Fend, PA-C  ibuprofen (ADVIL,MOTRIN) 600 MG tablet Take 1 tablet (600 mg total) by mouth every 6 (six) hours as needed. 07/16/18   Arabella Merles, CNM  magic mouthwash w/lidocaine SOLN Take 5 mLs by mouth 3 (three) times daily as needed for mouth pain. 10/04/18   Jeannie Fend, PA-C  Prenatal Vit-Fe Fumarate-FA (PRENATAL MULTIVITAMIN) TABS tablet Take 1 tablet by mouth daily at 12 noon.    [provider]    Family History Family History  Problem Relation Age of Onset  . Hyperlipidemia Brother   . Hypertension Brother   . Cancer Maternal Grandfather        Lung Ca  . Stroke Paternal Grandfather   .  Cancer Maternal Grandmother 74        Dec Esophogeal Ca  . Hypertension Mother     Social History Social History   Tobacco Use  . Smoking status: Never Smoker  . Smokeless tobacco: Never Used  Substance Use Topics  . Alcohol use: No    Alcohol/week: 0.0 standard drinks    Frequency: Never    Comment: only special occasions  . Drug use: No     Allergies   Patient has no known allergies.   Review of Systems Review of Systems  Constitutional: Positive for chills and fever.  HENT: Positive for mouth sores, sore throat, trouble swallowing and voice change. Negative for congestion, ear pain, facial swelling, rhinorrhea, sinus pressure, sinus pain and sneezing.   Respiratory: Negative for cough.   Gastrointestinal:  Negative for nausea and vomiting.  Musculoskeletal: Negative for arthralgias and myalgias.  Skin: Negative for rash and wound.  Allergic/Immunologic: Negative for immunocompromised state.  Neurological: Negative for headaches.  Hematological: Positive for adenopathy.  Psychiatric/Behavioral: Negative for confusion.  All other systems reviewed and are negative.    Physical Exam Updated Vital Signs BP 116/82   Pulse 94   Temp (!) 101.2 F (38.4 C) (Oral)   Resp 16   LMP 09/28/2018   SpO2 100%   Breastfeeding? No   Physical Exam  Constitutional: She is oriented to person, place, and time. She appears well-developed and well-nourished. No distress.  HENT:  Head: Normocephalic and atraumatic.  Right Ear: Tympanic membrane and ear canal normal.  Left Ear: Tympanic membrane and ear canal normal.  Mouth/Throat: Uvula is midline. Tonsils are 2+ on the right. Tonsils are 2+ on the left. Tonsillar exudate.  Fullness of soft pallet with ulcerated lesions to soft pallet   Cardiovascular: Normal rate, regular rhythm and normal heart sounds.  Pulmonary/Chest: Effort normal.  Lymphadenopathy:    She has cervical adenopathy.  Neurological: She is alert and oriented to person, place, and time.  Skin: Skin is warm and dry. No rash noted. She is not diaphoretic.  Psychiatric: She has a normal mood and affect. Her behavior is normal.  Nursing note and vitals reviewed.    ED Treatments / Results  Labs (all labs ordered are listed, but only abnormal results are displayed) Labs Reviewed  CBC - Abnormal; Notable for the following components:      Result Value   WBC 16.6 (*)    RBC 5.29 (*)    MCV 79.4 (*)    MCH 24.0 (*)    All other components within normal limits  BASIC METABOLIC PANEL - Abnormal; Notable for the following components:   Glucose, Bld 126 (*)    BUN <5 (*)    All other components within normal limits  RAPID HIV SCREEN (HIV 1/2 AB+AG)    EKG None  Radiology Ct  Soft Tissue Neck W Contrast  Result Date: 10/04/2018 CLINICAL DATA:  Sore throat for 3 days.  Inability to swallow. EXAM: CT NECK WITH CONTRAST TECHNIQUE: Multidetector CT imaging of the neck was performed using the standard protocol following the bolus administration of intravenous contrast. CONTRAST:  75mL OMNIPAQUE IOHEXOL 300 MG/ML  SOLN COMPARISON:  None. FINDINGS: Pharynx and larynx: On axial slices the epiglottis has a prominent thickness, but there is no confirmed thickening on sagittal reformats, nor is there altered density or overlying mucosal hyperenhancement. Aryrpiglottic folds are also non thickened. No airway narrowing or abscess. No retropharyngeal edema. Tonsils are not clearly enlarged. There are bilateral  palatine tonsilliths. Salivary glands: Nodule in the superficial right parotid attributed to lymph node. Thyroid: Normal Lymph nodes: Enlarged upper jugular and submandibular lymph nodes with mild adjacent fat stranding. No nodal cavitation. Vascular: Negative.  Major venous structures are patent Limited intracranial: Negative Visualized orbits: Negative Mastoids and visualized paranasal sinuses: Clear Skeleton: Negative Upper chest: Negative IMPRESSION: 1. Cervical adenitis. 2. No abscess or airway narrowing. Electronically Signed   By: Marnee Spring M.D.   On: 10/04/2018 14:36    Procedures Procedures (including critical care time)  Medications Ordered in ED Medications  sodium chloride 0.9 % bolus 2,000 mL (0 mLs Intravenous Stopped 10/04/18 1541)  dexamethasone (DECADRON) injection 10 mg (10 mg Intravenous Given 10/04/18 1338)  clindamycin (CLEOCIN) IVPB 600 mg (0 mg Intravenous Stopped 10/04/18 1419)  iohexol (OMNIPAQUE) 300 MG/ML solution 75 mL (75 mLs Intravenous Contrast Given 10/04/18 1422)  magic mouthwash (5 mLs Oral Given 10/04/18 1624)  HYDROcodone-acetaminophen (HYCET) 7.5-325 mg/15 ml solution 10 mL (10 mLs Oral Given 10/04/18 1640)     Initial Impression /  Assessment and Plan / ED Course  I have reviewed the triage vital signs and the nursing notes.  Pertinent labs & imaging results that were available during my care of the patient were reviewed by me and considered in my medical decision making (see chart for details).  Clinical Course as of Oct 04 1912  Tue Oct 04, 2018  [id number] 27 year old female presents with worsening sore throat.  Patient originally presented to urgent care 2 days ago and was diagnosed with right salivary gland infection, treated with Augmentin.  Patient reports worsening pain, difficulty swallowing, now with fever.  Patient presented to urgent care, was given medication for her fever and sent to the ER for evaluation.  On exam patient has questionable tender very shallow ulcers to the soft palate as well as the right buccal mucosa.  CT is negative for abscess.  Lab work with leukocytosis.  Patient was seen by Dr. Rhunette Croft, ER attending, recommends HIV screen which is negative today.  While in the ER patient was given fluids, Decadron, clindamycin in anticipation of an abscess.  Airway is patent, patient be discharged with prescription for liquid Norco, Magic mouthwash, she may continue on her Augmentin which she has been able to tolerate p.o. at home.  Also recommend she take Motrin suspension as needed for pain.  Patient referred to ENT for follow-up.  Advised return to emergency room for any worsening or concerning symptoms.   [LM]    Clinical Course User Index [LM] Jeannie Fend, PA-C   Final Clinical Impressions(s) / ED Diagnoses   Final diagnoses:  Pharyngitis, unspecified etiology    ED Discharge Orders         Ordered    HYDROcodone-acetaminophen (HYCET) 7.5-325 mg/15 ml solution  4 times daily PRN     10/04/18 1633    magic mouthwash w/lidocaine SOLN  3 times daily PRN     10/04/18 1633           Jeannie Fend, PA-C 10/04/18 1914    Derwood Kaplan, MD 10/05/18 1913

## 2018-10-04 NOTE — Discharge Instructions (Signed)
Recommending further evaluation and management in the ED given worsening symptoms including painful swallowing, difficulty swallowing liquids and own secretions, neck swelling, and fever.

## 2018-10-04 NOTE — Discharge Instructions (Addendum)
Follow-up with ear nose and throat, referral given.  Contact the office in the morning. Return to the ER for any difficulty breathing.  Return to the ER for worsening difficulty swallowing or any other concerning symptoms. Take Motrin as needed as directed.  Use the Magic mouthwash as prescribed for pain.  If additional pain medication as needed you may take the hydrocodone/tylenol as directed.

## 2018-10-04 NOTE — ED Notes (Signed)
This Rn attempted Iv access twice without success, another rn to try

## 2018-10-04 NOTE — ED Notes (Signed)
Patient transported to CT 

## 2018-10-04 NOTE — ED Triage Notes (Signed)
Pt in c/o sore throat onset x 3 days, pt seen at [facility] on Sunday and again today, per pt given Tylenol at 10:30 today, sent her for eval for peritonsillar abcess, pt reports inability to swallow and sore throat, A&O x4

## 2018-10-04 NOTE — ED Provider Notes (Signed)
[facility]   [id number] 10/04/18 Arrival Time: 1914  NW:GNFA THROAT  SUBJECTIVE: History from: patient.  Misty Kelly is a 28 y.o. female who presents with abrupt onset of sore throat and mouth ulcers that began 3 days ago.  Denies positive sick exposure or precipitating event.  Was seen in [facility] 2 days ago and treated with amoxicillin.  Patient took 2 doses yesterday and states her symptoms are much worse.  Complains of painful swallowing, decreased fluid intake, and drooling.  Has tried antibiotic and OTC tylenol without relief.  Symptoms are made worse with swallowing, difficulty swallowing liquids and secretions.  Denies previous symptoms in the past. Complains of LAD, fever, fatigue, odynophagia, sore throat, mouth ulcers, and dysphonia.  Denies chills, sinus pain, rhinorrhea, cough, SOB, wheezing, chest pain, nausea, rash, changes in bowel or bladder habits.     ROS: As per HPI.  Past Medical History:  Diagnosis Date  . Allergy   . STD (sexually transmitted disease) 01/2014   Tx'd for Chlamydia--did have neg. test of cure  . Urinary incontinence    Past Surgical History:  Procedure Laterality Date  . BREAST BIOPSY     No Known Allergies No current facility-administered medications on file prior to encounter.    Current Outpatient Medications on File Prior to Encounter  Medication Sig Dispense Refill  . amoxicillin-clavulanate (AUGMENTIN) 875-125 MG tablet Take 1 tablet by mouth every 12 (twelve) hours for 10 days. 20 tablet 0  . Ferrous Fumarate (HEMOCYTE - 106 MG FE) 324 (106 Fe) MG TABS tablet Take 1 tablet (106 mg of iron total) by mouth 2 (two) times daily. 60 tablet 3  . ibuprofen (ADVIL,MOTRIN) 600 MG tablet Take 1 tablet (600 mg total) by mouth every 6 (six) hours as needed. 30 tablet 0  . Prenatal Vit-Fe Fumarate-FA (PRENATAL MULTIVITAMIN) TABS tablet Take 1 tablet by mouth daily at 12 noon.     Social History   Socioeconomic History  . Marital status:  Single    Spouse name: Not on file  . Number of children: Not on file  . Years of education: Not on file  . Highest education level: Not on file  Occupational History  . Not on file  Social Needs  . Financial resource strain: Not on file  . Food insecurity:    Worry: Not on file    Inability: Not on file  . Transportation needs:    Medical: Not on file    Non-medical: Not on file  Tobacco Use  . Smoking status: Never Smoker  . Smokeless tobacco: Never Used  Substance and Sexual Activity  . Alcohol use: No    Alcohol/week: 0.0 standard drinks    Frequency: Never    Comment: only special occasions  . Drug use: No  . Sexual activity: Yes    Partners: Male    Birth control/protection: None  Lifestyle  . Physical activity:    Days per week: Not on file    Minutes per session: Not on file  . Stress: Not on file  Relationships  . Social connections:    Talks on phone: Not on file    Gets together: Not on file    Attends religious service: Not on file    Active member of club or organization: Not on file    Attends meetings of clubs or organizations: Not on file    Relationship status: Not on file  . Intimate partner violence:    Fear of current or  ex partner: Not on file    Emotionally abused: Not on file    Physically abused: Not on file    Forced sexual activity: Not on file  Other Topics Concern  . Not on file  Social History Narrative  . Not on file   Family History  Problem Relation Age of Onset  . Hyperlipidemia Brother   . Hypertension Brother   . Cancer Maternal Grandfather        Lung Ca  . Stroke Paternal Grandfather   . Cancer Maternal Grandmother 59        Dec Esophogeal Ca  . Hypertension Mother     OBJECTIVE:  Vitals:   10/04/18 1040  BP: 127/78  Pulse: (!) 117  Resp: 18  Temp: (!) 102.8 F (39.3 C)  TempSrc: Oral  SpO2: 96%     General appearance: alert; appears fatigued HEENT: Limit exam due to patient discomfort: NCAT; Ears: EACs  clear, TMs appear erythematous; Eyes: PERRL, EOMI grossly, tearful during exam; Nose: clear rhinorrhea; Throat: soft palate erythematous and swollen, uvula with slight deviation to patients left; white plaques on bilateral buccal mucosa; difficulty with opening mouth; hot potato voice present Neck: LAD Lungs: CTA bilaterally without adventitious breath sounds Heart: regular rate and rhythm.  Radial pulses 2+ symmetrical bilaterally Skin: warm and dry Psychological: alert and cooperative; normal mood and affect; tearful during examination given discomfort  ASSESSMENT & PLAN:  1. Peritonsillar abscess   2. Sore throat   3. Odynophagia     Meds ordered this encounter  Medications  . acetaminophen (TYLENOL) solution 650 mg   Recommending further evaluation and management in the ED given worsening symptoms including painful swallowing, difficulty swallowing liquids and own secretions, neck swelling, and fever.  Reviewed expectations re: course of current medical issues. Questions answered. Outlined signs and symptoms indicating need for more acute intervention. Patient verbalized understanding. After Visit Summary given.        Rennis Harding, PA-C 10/04/18 1141

## 2018-10-07 ENCOUNTER — Observation Stay (HOSPITAL_COMMUNITY)
Admission: EM | Admit: 2018-10-07 | Discharge: 2018-10-13 | DRG: 159 | Disposition: A | Discharge status: Home / Self Care | Payer: BLUE CROSS/BLUE SHIELD | Attending: Internal Medicine | Admitting: Internal Medicine

## 2018-10-07 ENCOUNTER — Encounter (HOSPITAL_COMMUNITY): Payer: Self-pay

## 2018-10-07 DIAGNOSIS — B9789 Other viral agents as the cause of diseases classified elsewhere: Secondary | ICD-10-CM | POA: Diagnosis present

## 2018-10-07 DIAGNOSIS — Z833 Family history of diabetes mellitus: Secondary | ICD-10-CM

## 2018-10-07 DIAGNOSIS — Z823 Family history of stroke: Secondary | ICD-10-CM | POA: Diagnosis not present

## 2018-10-07 DIAGNOSIS — Z8349 Family history of other endocrine, nutritional and metabolic diseases: Secondary | ICD-10-CM | POA: Diagnosis not present

## 2018-10-07 DIAGNOSIS — E860 Dehydration: Secondary | ICD-10-CM | POA: Diagnosis not present

## 2018-10-07 DIAGNOSIS — K1230 Oral mucositis (ulcerative), unspecified: Secondary | ICD-10-CM

## 2018-10-07 DIAGNOSIS — R011 Cardiac murmur, unspecified: Secondary | ICD-10-CM

## 2018-10-07 DIAGNOSIS — K121 Other forms of stomatitis: Secondary | ICD-10-CM | POA: Diagnosis present

## 2018-10-07 DIAGNOSIS — D560 Alpha thalassemia: Secondary | ICD-10-CM

## 2018-10-07 DIAGNOSIS — D569 Thalassemia, unspecified: Secondary | ICD-10-CM | POA: Diagnosis not present

## 2018-10-07 DIAGNOSIS — R32 Unspecified urinary incontinence: Secondary | ICD-10-CM

## 2018-10-07 DIAGNOSIS — B370 Candidal stomatitis: Principal | ICD-10-CM | POA: Diagnosis present

## 2018-10-07 DIAGNOSIS — Z23 Encounter for immunization: Secondary | ICD-10-CM | POA: Diagnosis not present

## 2018-10-07 DIAGNOSIS — Z8619 Personal history of other infectious and parasitic diseases: Secondary | ICD-10-CM

## 2018-10-07 DIAGNOSIS — Z8249 Family history of ischemic heart disease and other diseases of the circulatory system: Secondary | ICD-10-CM | POA: Diagnosis not present

## 2018-10-07 LAB — BASIC METABOLIC PANEL
Anion gap: 11 (ref 5–15)
BUN: 7 mg/dL (ref 6–20)
CO2: 23 mmol/L (ref 22–32)
Calcium: 9.2 mg/dL (ref 8.9–10.3)
Chloride: 104 mmol/L (ref 98–111)
Creatinine, Ser: 0.81 mg/dL (ref 0.44–1.00)
GFR calc Af Amer: >60 mL/min (ref >60)
GFR calc non Af Amer: >60 mL/min (ref >60)
Glucose, Bld: 97 mg/dL (ref 70–99)
Potassium: 3.4 mmol/L — ABNORMAL LOW (ref 3.5–5.1)
Sodium: 138 mmol/L (ref 135–145)

## 2018-10-07 LAB — CBC WITH DIFFERENTIAL/PLATELET
Abs Immature Granulocytes: 0.02 10*3/uL (ref 0.00–0.07)
Basophils Absolute: 0 10*3/uL (ref 0.0–0.1)
Basophils Relative: 0 %
Eosinophils Absolute: 0.1 10*3/uL (ref 0.0–0.5)
Eosinophils Relative: 1 %
HCT: 40.2 % (ref 36.0–46.0)
Hemoglobin: 12.2 g/dL (ref 12.0–15.0)
Immature Granulocytes: 0 %
Lymphocytes Relative: 19 %
Lymphs Abs: 1.9 10*3/uL (ref 0.7–4.0)
MCH: 23.8 pg — ABNORMAL LOW (ref 26.0–34.0)
MCHC: 30.3 g/dL (ref 30.0–36.0)
MCV: 78.5 fL — ABNORMAL LOW (ref 80.0–100.0)
Monocytes Absolute: 0.8 10*3/uL (ref 0.1–1.0)
Monocytes Relative: 8 %
Neutro Abs: 7.1 10*3/uL (ref 1.7–7.7)
Neutrophils Relative %: 72 %
Platelets: 362 10*3/uL (ref 150–400)
RBC: 5.12 MIL/uL — ABNORMAL HIGH (ref 3.87–5.11)
RDW: 12.3 % (ref 11.5–15.5)
WBC: 9.9 10*3/uL (ref 4.0–10.5)
nRBC: 0 % (ref 0.0–0.2)

## 2018-10-07 LAB — I-STAT CG4 LACTIC ACID, ED: Lactic Acid, Venous: 0.85 mmol/L (ref 0.5–1.9)

## 2018-10-07 LAB — GROUP A STREP BY PCR: Group A Strep by PCR: NOT DETECTED

## 2018-10-07 LAB — MONONUCLEOSIS SCREEN: Mono Screen: NEGATIVE

## 2018-10-07 MED ORDER — MORPHINE SULFATE (PF) 4 MG/ML IV SOLN
4.0000 mg | Freq: Once | INTRAVENOUS | Status: AC
  Administered 2018-10-07: 4 mg via INTRAVENOUS
  Filled 2018-10-07: qty 1

## 2018-10-07 MED ORDER — MAGIC MOUTHWASH W/LIDOCAINE
5.0000 mL | Freq: Three times a day (TID) | ORAL | Status: DC | PRN
  Administered 2018-10-07 – 2018-10-08 (×3): 5 mL via ORAL
  Filled 2018-10-07 (×4): qty 5

## 2018-10-07 MED ORDER — HYDROMORPHONE HCL 1 MG/ML IJ SOLN
1.0000 mg | INTRAMUSCULAR | Status: DC | PRN
  Administered 2018-10-07 – 2018-10-12 (×22): 1 mg via INTRAVENOUS
  Filled 2018-10-07 (×23): qty 1

## 2018-10-07 MED ORDER — ACYCLOVIR 200 MG/5ML PO SUSP
400.0000 mg | Freq: Three times a day (TID) | ORAL | Status: DC
  Administered 2018-10-07 – 2018-10-13 (×18): 400 mg via ORAL
  Filled 2018-10-07 (×20): qty 10

## 2018-10-07 MED ORDER — KETOROLAC TROMETHAMINE 15 MG/ML IJ SOLN
15.0000 mg | Freq: Four times a day (QID) | INTRAMUSCULAR | Status: DC | PRN
  Administered 2018-10-07 – 2018-10-09 (×5): 15 mg via INTRAVENOUS
  Filled 2018-10-07 (×6): qty 1

## 2018-10-07 MED ORDER — ENOXAPARIN SODIUM 40 MG/0.4ML ~~LOC~~ SOLN
40.0000 mg | SUBCUTANEOUS | Status: DC
  Administered 2018-10-07 – 2018-10-12 (×6): 40 mg via SUBCUTANEOUS
  Filled 2018-10-07 (×6): qty 0.4

## 2018-10-07 MED ORDER — POTASSIUM CHLORIDE 2 MEQ/ML IV SOLN
INTRAVENOUS | Status: DC
  Administered 2018-10-07: 17:00:00 via INTRAVENOUS
  Filled 2018-10-07 (×2): qty 1000

## 2018-10-07 MED ORDER — ONDANSETRON HCL 4 MG/2ML IJ SOLN
4.0000 mg | Freq: Once | INTRAMUSCULAR | Status: AC
  Administered 2018-10-07: 4 mg via INTRAVENOUS
  Filled 2018-10-07: qty 2

## 2018-10-07 MED ORDER — MAGIC MOUTHWASH
10.0000 mL | Freq: Once | ORAL | Status: AC
  Administered 2018-10-07: 10 mL via ORAL
  Filled 2018-10-07: qty 10

## 2018-10-07 MED ORDER — POLYETHYLENE GLYCOL 3350 17 G PO PACK
17.0000 g | PACK | Freq: Every day | ORAL | Status: DC
  Administered 2018-10-10 – 2018-10-13 (×3): 17 g via ORAL
  Filled 2018-10-07 (×5): qty 1

## 2018-10-07 MED ORDER — ONDANSETRON HCL 4 MG/2ML IJ SOLN
4.0000 mg | Freq: Four times a day (QID) | INTRAMUSCULAR | Status: DC | PRN
  Administered 2018-10-09 – 2018-10-10 (×3): 4 mg via INTRAVENOUS
  Filled 2018-10-07 (×4): qty 2

## 2018-10-07 MED ORDER — OXYCODONE HCL 5 MG PO TABS
5.0000 mg | ORAL_TABLET | ORAL | Status: DC | PRN
  Administered 2018-10-08 – 2018-10-09 (×2): 5 mg via ORAL
  Filled 2018-10-07 (×2): qty 1

## 2018-10-07 MED ORDER — ONDANSETRON HCL 4 MG PO TABS: 4.0000 mg | ORAL_TABLET | Freq: Four times a day (QID) | ORAL | Status: DC | PRN

## 2018-10-07 MED ORDER — ACETAMINOPHEN 650 MG RE SUPP: 650.0000 mg | Freq: Four times a day (QID) | RECTAL | Status: DC | PRN

## 2018-10-07 MED ORDER — POTASSIUM CHLORIDE 2 MEQ/ML IV SOLN
INTRAVENOUS | Status: DC
  Administered 2018-10-07 – 2018-10-09 (×3): via INTRAVENOUS
  Filled 2018-10-07 (×6): qty 1000

## 2018-10-07 MED ORDER — ACETAMINOPHEN 325 MG PO TABS: 650.0000 mg | ORAL_TABLET | Freq: Four times a day (QID) | ORAL | Status: DC | PRN

## 2018-10-07 MED ORDER — SODIUM CHLORIDE 0.9 % IV BOLUS
1000.0000 mL | Freq: Once | INTRAVENOUS | Status: AC
  Administered 2018-10-07: 1000 mL via INTRAVENOUS

## 2018-10-07 NOTE — ED Notes (Signed)
Admit doctor at bedside

## 2018-10-07 NOTE — H&P (Addendum)
Date: 10/07/2018               Patient Name:  Misty Kelly MRN: [id number]  DOB: 1991-02-22 Age / Sex: 27 y.o., female   PCP: Department, [facility]         Medical Service: Internal Medicine Teaching Service         Attending Physician: Dr. Sandre Kitty, Elwin Mocha, MD    First Contact: Dr. Maryla Morrow Pager: [phone]  Second Contact: Dr. Caron Presume Pager: [phone]       After Hours (After 5p/  First Contact Pager: [phone]  weekends / holidays): Second Contact Pager: [phone]   Chief Complaint: Painful swallowing and ulcer in mouth.  History of Present Illness: Misty Kelly is a 27 year old female 2 months postpartum who presented to the ED with progressive mouth and throat pain of 6-7 days durations. She initially developed mouth pain, right sided facial swelling, and discomfort with swallowing. Over the course of the day her symptoms progressed and she presented to and urgent care for further evaluation. Initial concern was for parotitis and she was prescribed Augmentin. Unfortunately with initiation of antibiotics her symptoms did not improve but instead continued to progress. She then presented to the [facility] ED on 11/05 for further evaluation. CT of the neck was performed to evaluated for a peritonsillar abscess and fortunately returned unremarkable aside from some cervical adenitis (no airway compromise). She was subsequently discharged with hydrocodone, magic mouthwash, and a referral to ENT. On 11/07 she was evaluated by Dr. Pollyann Kennedy who noted vesicular lesions on her  Tongue, lips, and oropharynx. He felt this was consistent with viral stomatitis, started acyclovir and, recommended admission for hydration. The patient declined but today her symptoms progressed and she felt she needed to come to the hospital.   She denies recent dental procedures, initiation of new medication prior to the onset of symptoms, exposure to inhaled chemicals, changes in diet, rash, conjunctivitis, blurry vision,  arthralgias, myalgias, vaginal ulcers, photosensitivity. She is sexually active with males and participates in both oral and vaginal intercourse. She has not been sexually active in >2 months. She does have a history of STI in 2014. No family history of autoimmune diseases. Eats a balanced diet. No sick contact or contact with young children.   Meds:  Current Meds  Medication Sig  . acetaminophen (TYLENOL) 500 MG tablet Take 1,000 mg by mouth every 6 (six) hours as needed for mild pain, moderate pain or headache.  Marland Kitchen acyclovir (ZOVIRAX) 400 MG tablet Take 400 mg by mouth 3 (three) times daily.  Marland Kitchen HYDROcodone-acetaminophen (HYCET) 7.5-325 mg/15 ml solution Take 15 mLs by mouth 4 (four) times daily as needed for moderate pain.  . magic mouthwash w/lidocaine SOLN Take 5 mLs by mouth 3 (three) times daily as needed for mouth pain.  . [DISCONTINUED] HYDROcodone-acetaminophen (HYCET) 7.5-325 mg/15 ml solution Take 15 mLs by mouth 4 (four) times daily as needed.   Allergies: Allergies as of 10/07/2018  . (No Known Allergies)   Past Medical History:  Diagnosis Date  . Allergy   . STD (sexually transmitted disease) 01/2014   Tx'd for Chlamydia--did have neg. test of cure  . Urinary incontinence    Family History:  Non significant except DM in mother  Social History:  Lives with parents. Not currently sexually active as still healing post partum. Only had sex with female. Denies alcohol, smoking and EtOh use. Is not currently working.   Review of Systems: A complete ROS was  negative except as per HPI.  Physical Exam: Blood pressure 131/88, pulse 74, temperature (!) 100.4 F (38 C), temperature source Oral, resp. rate 15, height 5\' 5"  (1.651 m), weight 79.4 kg, last menstrual period 09/28/2018, SpO2 100 %, not currently breastfeeding. HEENT:has swelling on soft tissue at her neck, tenderness at auricles bilaterally, tongue swelling and ulcers and white patches at lateral side of her mough.  Cervical LAP CV: RRR, systolic murmur at P area Lungs: CTA bilaterally, no rale, no wheeze Abdomen: Is soft and non tender, BS hypoactive but present Extremities: No LEE. No rash or lesion on Palms or plantar fet Skin: No skin lesion or rash on   Assessment & Plan by Problem: Active Problems:   Stomatitis and mucositis Misty Kelly is a 27 year old female 2 months postpartum who presented to the ED with progressive mouth and throat pain of 6-7 days durations. She develped mouth pain, right sided facial swelling, and discomfort with swallowing.   Stomatitis and mucositis: treatment with Amoxicillin and antiviral started but patient was not improved. She were not been able to eat or drink and can  No respiratory distress or problem with breathing On exam, has swelling on soft tissue at her neck, tenderness at auricles bilaterally, tongue swelling and ulcers and white patches at lateral side of her mouth. DDX: Viral osteomatosis STD: Not sexually active now. No rash on palm or on foot Autoimmune disease--> No other clinical presentation Allergic reaction to chemical or medication induced--> denies any exposure to chemicals. Is not on any medications.  Vitamin deficinecy--> Acute onset makes it less likely  -Acyclovir suspension 400 mg p.o. 3 times daily -Ketorolac 15 mg IV every 6 hours as needed -Dilaudid 1 mg IV every 4 hours as needed -R 100 mL/h -Oxycodone 5 mg every 4 hours as needed -CBC daily -BMP daily -Mononucleosis Screen -Strep group A PCR -Magic mouthwash with lidocaine PRN  Diet: Soft IV fluid: LR 100 mL/h VTE ppx: Lovenox Code status: Full  Dispo: Admit patient to Inpatient with expected length of stay greater than 2 midnights.  SignedChevis Pretty, MD 10/07/2018, 5:37 PM  Pager: [phone]

## 2018-10-07 NOTE — ED Notes (Signed)
PA at bedside.

## 2018-10-07 NOTE — ED Provider Notes (Signed)
[facility] EMERGENCY DEPARTMENT Provider Note   CSN: [id number] Arrival date & time: 10/07/18  1006     History   Chief Complaint Chief Complaint  Patient presents with  . unable to swallow    HPI Misty Kelly is a 27 y.o. female.  The history is provided by the patient and medical records. No language interpreter was used.     27 year old female, 2 weeks postpartum presenting complaining of painful swallowing.  Patient report for the past 4 days she has had progressive worsening discomfort in her throat and mouth.  Described pain as a soreness sharp throbbing sensation with difficulty swallowing secondary to the pain.  She does complain of neck pain bilateral ear pain.  She denies having fever or chills no chest pain or shortness of breath or productive cough.  No runny nose sneezing.  She was initially seen at the PCP and was diagnosed with having salivary gland infection and was prescribed amoxicillin.  She then was told that she could have a peritonsillar abscess but after seen in the ED 3 days ago due to worsening throat pain and CT scan was obtained that was negative and patient was referred to an ENT specialist.  She saw ENT specialist yesterday for her complaint, was started on antiviral medication, acyclovir for her infection.  She was recommended to come to the ED today because she is having worsening symptoms, unable to eat or drink anything and still have moderate pain.  She denies any new medication prior to start of her symptoms.  She denies any abnormal ulceration in her vaginal region or having any rash.  She has HIV testing that was negative.  Past Medical History:  Diagnosis Date  . Allergy   . STD (sexually transmitted disease) 01/2014   Tx'd for Chlamydia--did have neg. test of cure  . Urinary incontinence     Patient Active Problem List   Diagnosis Date Noted  . Postpartum anemia 07/16/2018  . Obstetric vaginal laceration with type 3b third  degree perineal laceration 07/16/2018  . Vaginal delivery 07/14/2018  . Postpartum hemorrhage 07/14/2018  . Indication for care in labor or delivery 07/13/2018  . Positive GBS test 07/13/2018  . 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 Abnormal glucose affecting pregnancy 06/10/2018  . Encounter for supervision of other normal pregnancy, third trimester 06/10/2018    Past Surgical History:  Procedure Laterality Date  . BREAST BIOPSY       OB History    Gravida  2   Para  1   Term  1   Preterm  0   AB  1   Living  1     SAB  1   TAB  0   Ectopic  0   Multiple  0   Live Births  1            Home Medications    Prior to Admission medications   Medication Sig Start Date End Date Taking? Authorizing Provider  amoxicillin-clavulanate (AUGMENTIN) 875-125 MG tablet Take 1 tablet by mouth every 12 (twelve) hours for 10 days. 10/02/18 10/12/18  Georgetta Haber, NP  Ferrous Fumarate (HEMOCYTE - 106 MG FE) 324 (106 Fe) MG TABS tablet Take 1 tablet (106 mg of iron total) by mouth 2 (two) times daily. 07/16/18   Arabella Merles, CNM  HYDROcodone-acetaminophen (HYCET) 7.5-325 mg/15 ml solution Take 15 mLs by mouth 4 (four) times daily as needed for moderate pain. 10/04/18 10/04/19  Jeannie Fend, PA-C  ibuprofen (ADVIL,MOTRIN) 600 MG tablet Take 1 tablet (600 mg total) by mouth every 6 (six) hours as needed. 07/16/18   Arabella Merles, CNM  magic mouthwash w/lidocaine SOLN Take 5 mLs by mouth 3 (three) times daily as needed for mouth pain. 10/04/18   Jeannie Fend, PA-C  Prenatal Vit-Fe Fumarate-FA (PRENATAL MULTIVITAMIN) TABS tablet Take 1 tablet by mouth daily at 12 noon.    [provider]    Family History Family History  Problem Relation Age of Onset  . Hyperlipidemia Brother   . Hypertension Brother   . Cancer Maternal Grandfather        Lung Ca  . Stroke Paternal Grandfather   . Cancer Maternal Grandmother 60        Dec Esophogeal Ca  .  Hypertension Mother     Social History Social History   Tobacco Use  . Smoking status: Never Smoker  . Smokeless tobacco: Never Used  Substance Use Topics  . Alcohol use: No    Alcohol/week: 0.0 standard drinks    Frequency: Never    Comment: only special occasions  . Drug use: No     Allergies   Patient has no known allergies.   Review of Systems Review of Systems  All other systems reviewed and are negative.    Physical Exam Updated Vital Signs BP 125/75 (BP Location: Right Arm)   Pulse 70   Temp 100.1 F (37.8 C) (Oral)   Resp 16   Ht 5\' 5"  (1.651 m)   Wt 79.4 kg   LMP 09/28/2018   SpO2 99%   BMI 29.12 kg/m   Physical Exam  Constitutional: She is oriented to person, place, and time. She appears well-developed and well-nourished. No distress.  HENT:  Head: Atraumatic.  Ears: TMs perforation bilaterally with cholesterolemia but no erythema Nose: Normal nares Throat: Desquamation of mucosa most noticeable to soft palate and posterior oropharyngeal region.  Uvula midline, trismus noted   Eyes: Conjunctivae are normal.  Neck: Neck supple.  No nuchal rigidity  Cardiovascular:  Mild tachycardia without murmur rubs or gallop  Pulmonary/Chest: Effort normal and breath sounds normal.  Abdominal: Soft. Bowel sounds are normal. She exhibits no distension. There is no tenderness.  Lymphadenopathy:    She has cervical adenopathy.  Neurological: She is alert and oriented to person, place, and time.  Skin: No rash noted.  Psychiatric: She has a normal mood and affect.  Nursing note and vitals reviewed.    ED Treatments / Results  Labs (all labs ordered are listed, but only abnormal results are displayed) Labs Reviewed  CBC WITH DIFFERENTIAL/PLATELET - Abnormal; Notable for the following components:      Result Value   RBC 5.12 (*)    MCV 78.5 (*)    MCH 23.8 (*)    All other components within normal limits  BASIC METABOLIC PANEL - Abnormal; Notable for  the following components:   Potassium 3.4 (*)    All other components within normal limits  GROUP A STREP BY PCR  MONONUCLEOSIS SCREEN  I-STAT CG4 LACTIC ACID, ED    EKG None  Radiology No results found.  Procedures Procedures (including critical care time)  Medications Ordered in ED Medications  sodium chloride 0.9 % bolus 1,000 mL (0 mLs Intravenous Stopped 10/07/18 1130)  morphine 4 MG/ML injection 4 mg (4 mg Intravenous Given 10/07/18 1059)  magic mouthwash (10 mLs Oral Given 10/07/18 1101)  ondansetron (ZOFRAN) injection 4  mg (4 mg Intravenous Given 10/07/18 1057)  morphine 4 MG/ML injection 4 mg (4 mg Intravenous Given 10/07/18 1314)     Initial Impression / Assessment and Plan / ED Course  I have reviewed the triage vital signs and the nursing notes.  Pertinent labs & imaging results that were available during my care of the patient were reviewed by me and considered in my medical decision making (see chart for details).     BP 125/75 (BP Location: Right Arm)   Pulse 70   Temp 100.1 F (37.8 C) (Oral)   Resp 16   Ht 5\' 5"  (1.651 m)   Wt 79.4 kg   LMP 09/28/2018   SpO2 99%   BMI 29.12 kg/m    Final Clinical Impressions(s) / ED Diagnoses   Final diagnoses:  Viral stomatitis    ED Discharge Orders    None     10:25 AM Patient here with ulceration and desquamation of oral mucosa for the past 3 to 4 days.  Has been seen several times for her complaint and was treated with amoxicillin as well as antiviral medication with minimal improvement.  She is having difficulty swallowing secondary to pain.  She does not have any signs of airway compromise.  She has had a recent CT scan of the affected area without evidence of abscess.  Patient was seen by ENT specialist, Dr. Pollyann Kennedy yesterday for her complaint.  She was diagnosed with viral stomatitis and recommend supportive care as well as antiviral medication.  Initially he did offer to admit her to the hospital for IV  fluid support but patient prefers to try it at home.  She is here due to worsening of her symptoms.  2:28 PM Appreciate consultation from Internal Medicine resident who agrees to see and admit pt for supportive care of her viral stomatitis with difficulty with PO intake.     Fayrene Helper, PA-C 10/07/18 1433    Gwyneth Sprout, MD 10/08/18 [phone]

## 2018-10-07 NOTE — ED Triage Notes (Signed)
Pt from home; last Saturday pt began having R inside cheek pain; diagnosed w/ peritonisillar abscess, sent home w/ amoxacillin; pt endorses fever Monday, not feeling any better, noticed white patches inside mouth and ear pain, came to [facility] Tuesday; told to follow up w/ ENT; ENT sent pt home w/ antiviral medication yesterday; pt unable to eat or drink much since Sunday, was told by ENT to come to ED for evaluation

## 2018-10-08 ENCOUNTER — Encounter (HOSPITAL_COMMUNITY): Payer: Self-pay

## 2018-10-08 ENCOUNTER — Other Ambulatory Visit: Payer: Self-pay

## 2018-10-08 DIAGNOSIS — D560 Alpha thalassemia: Secondary | ICD-10-CM | POA: Diagnosis not present

## 2018-10-08 DIAGNOSIS — K1230 Oral mucositis (ulcerative), unspecified: Secondary | ICD-10-CM | POA: Diagnosis not present

## 2018-10-08 DIAGNOSIS — K121 Other forms of stomatitis: Secondary | ICD-10-CM

## 2018-10-08 DIAGNOSIS — R011 Cardiac murmur, unspecified: Secondary | ICD-10-CM | POA: Diagnosis not present

## 2018-10-08 DIAGNOSIS — B9789 Other viral agents as the cause of diseases classified elsewhere: Secondary | ICD-10-CM | POA: Diagnosis present

## 2018-10-08 LAB — BASIC METABOLIC PANEL
Anion gap: 9 (ref 5–15)
BUN: 6 mg/dL (ref 6–20)
CO2: 25 mmol/L (ref 22–32)
Calcium: 8.7 mg/dL — ABNORMAL LOW (ref 8.9–10.3)
Chloride: 101 mmol/L (ref 98–111)
Creatinine, Ser: 0.74 mg/dL (ref 0.44–1.00)
GFR calc Af Amer: >60 mL/min (ref >60)
GFR calc non Af Amer: >60 mL/min (ref >60)
Glucose, Bld: 102 mg/dL — ABNORMAL HIGH (ref 70–99)
Potassium: 3.4 mmol/L — ABNORMAL LOW (ref 3.5–5.1)
Sodium: 135 mmol/L (ref 135–145)

## 2018-10-08 LAB — CBC
HCT: 36.1 % (ref 36.0–46.0)
Hemoglobin: 11 g/dL — ABNORMAL LOW (ref 12.0–15.0)
MCH: 23.8 pg — ABNORMAL LOW (ref 26.0–34.0)
MCHC: 30.5 g/dL (ref 30.0–36.0)
MCV: 78 fL — ABNORMAL LOW (ref 80.0–100.0)
Platelets: 345 10*3/uL (ref 150–400)
RBC: 4.63 MIL/uL (ref 3.87–5.11)
RDW: 12.3 % (ref 11.5–15.5)
WBC: 8.9 10*3/uL (ref 4.0–10.5)
nRBC: 0 % (ref 0.0–0.2)

## 2018-10-08 MED ORDER — INFLUENZA VAC SPLIT QUAD 0.5 ML IM SUSY
0.5000 mL | PREFILLED_SYRINGE | INTRAMUSCULAR | Status: AC
  Administered 2018-10-09: 0.5 mL via INTRAMUSCULAR
  Filled 2018-10-08: qty 0.5

## 2018-10-08 MED ORDER — MAGIC MOUTHWASH W/LIDOCAINE
5.0000 mL | Freq: Four times a day (QID) | ORAL | Status: DC | PRN
  Administered 2018-10-08 – 2018-10-09 (×6): 5 mL via ORAL
  Filled 2018-10-08 (×6): qty 5

## 2018-10-08 MED ORDER — FLUCONAZOLE IN SODIUM CHLORIDE 200-0.9 MG/100ML-% IV SOLN
200.0000 mg | INTRAVENOUS | Status: AC
  Administered 2018-10-08 – 2018-10-10 (×3): 200 mg via INTRAVENOUS
  Filled 2018-10-08 (×3): qty 100

## 2018-10-08 NOTE — Progress Notes (Addendum)
   Subjective: Ms. Masullo was seen and evaluated at bedside on morning rounds. She still has pain particularly with swallowing. How ever Lidocaine mouthwash helped her to be able to eat food. Denies any difficulty breathing.  Did not have any acute event over night. We talked about he possible viral etiology and plan of care. She agrees to continue treatment.   Objective:  Vital signs in last 24 hours: Vitals:   10/07/18 1545 10/07/18 1612 10/07/18 2116 10/08/18 0518  BP: (!) 162/70 131/88 (!) 132/91 123/80  Pulse: 62 74 81 84  Resp: 14 15 12 12   Temp:  (!) 100.4 F (38 C) (!) 100.8 F (38.2 C) 98.9 F (37.2 C)  TempSrc:  Oral Oral   SpO2: 97% 100% 99% 100%  Weight:  79.4 kg    Height:  5\' 5"  (1.651 m)     Physical Exam  Constitutional: She is oriented to person, place, and time. She appears well-developed and well-nourished. No distress.  HENT: Some swelling on face and neck. Mobile, tender, small LAP at left side of neck. Mouth/Throat: Oral lesions present. Ulcers and erythematous mucosa as well as white patches on interior lateral wall of her mouth. No vesicle is visible. Eyes: EOM are normal.  Neck: Normal range of motion. Neck supple.  Cardiovascular: Normal rate, regular rhythm and intact distal pulses.  Murmur heard. Pulmonary/Chest: Effort normal and breath sounds normal. No respiratory distress. She has no wheezes. She has no rales.  Abdominal: Soft. Bowel sounds are normal. She exhibits no distension. There is no tenderness.  Musculoskeletal: She exhibits no edema.  Neurological: She is alert and oriented to person, place, and time.  Skin: Skin is warm.  Psychiatric: She has a normal mood and affect. Her behavior is normal.   Assessment/Plan:  Active Problems:   Stomatitis and mucositis Ms. Schexnider is a 27 year old female 2 months postpartum who presented to the ED with progressive mouth and throat pain of 6-7 days durations. She develped mouth pain, right sided facial  swelling, and discomfort with swallowing. Has not been able to eat due to pain.  Stomatitis and mucositis: No respiratory distress or problem with breathing On exam, has swelling on soft tissue at her neck ulcers and white patches at lateral wall of her mouth. No vesicle tenderness at auricles bilaterally improved Mononucleosis Screen, Strep group A PCR and HIV negative  [place] 2/2 Viral osteomatosis superimposed with candida  -c/w Acyclovir suspension 400 mg p.o. 3 times daily -IV fluconazole 200 mh QD -Ketorolac 15 mg IV every 6 hours as needed -Dilaudid 1 mg IV every 4 hours as needed -LR 100 mL/h -Oxycodone 5 mg every 4 hours as needed -CBC daily -BMP daily -Magic mouthwash with lidocaine PRN  Diet: Soft IV fluid: LR 100 mL/h VTE ppx: Lovenox Code status: Full  Dispo: Anticipated discharge in approximately 2-3 days Chevis Pretty, MD 10/08/2018, 11:52 AM Pager: [phone]

## 2018-10-08 NOTE — Progress Notes (Signed)
Internal Medicine Attending  Date: 10/08/2018  Patient name: Misty Kelly Medical record number: [id number] Date of birth: 05-14-1991 Age: 27 y.o. Gender: female  I saw and evaluated the patient. I reviewed the resident's note by Dr. Maryla Morrow and I agree with the resident's findings and plans as documented in her progress note.  Please see my H&P dated October 08, 2018 and attached to Dr. Enis Gash H&P dated October 07, 2018 for the specifics of my evaluation, assessment, and plan from earlier in the day.

## 2018-10-09 DIAGNOSIS — K1230 Oral mucositis (ulcerative), unspecified: Secondary | ICD-10-CM | POA: Diagnosis not present

## 2018-10-09 DIAGNOSIS — K121 Other forms of stomatitis: Secondary | ICD-10-CM | POA: Diagnosis not present

## 2018-10-09 MED ORDER — OXYCODONE-ACETAMINOPHEN 5-325 MG PO TABS
1.0000 | ORAL_TABLET | ORAL | Status: DC | PRN
  Administered 2018-10-09 – 2018-10-13 (×16): 2 via ORAL
  Filled 2018-10-09 (×17): qty 2

## 2018-10-09 NOTE — Progress Notes (Signed)
   Subjective: Patient initially resting comfortably. She is still in pain. Feels that the pain on the side of her mouth has improved but she continues to have significantly pain on the tongue and in the posterior oropharynx. She feels that the dilaudid only helps for about and hour and the oxycodone helps for longer. She has been unable to take much of the oxy due to the discomfort with swallowing. We discussed that we will try to transition to oral medications over the next 1-2 days with plans to discharge in the coming days. We will continue acyclovir and fluconazole. She voices understanding. All questions and concerns addressed.   Objective: Vital signs in last 24 hours: Vitals:   10/08/18 0518 10/08/18 1511 10/08/18 2029 10/09/18 0500  BP: 123/80 132/85 (!) 140/98 (!) 124/91  Pulse: 84 93 83 93  Resp: 12 18 16 18   Temp: 98.9 F (37.2 C) 98.9 F (37.2 C) 98.3 F (36.8 C) 98.1 F (36.7 C)  TempSrc:   Oral Oral  SpO2: 100% 96% 96% 96%  Weight:      Height:       General: Well nourished female in no acute distress HENT: Able to open her mouth a little more, continued white plaques  Pulm: Good air movement with no wheezing or crackles  CV: RRR, psychological splitting of S2  Extremities: No LE edema   Assessment/Plan:  Misty Kelly is a 27 y.o female who presented to the ED with progressive mouth and throat pain of 6-7 days duration. She has had decreased PO intake secondary to the pain and was subsequently admitted for dehydration and pain control.   Viral Stomatitis/Mucosisitis  - Pain is slightly improved. She has received 7 mg of dilaudid, 60 mg of Toradol, and 10 mg of oxycodone over the past 24 hours.  - Need to transition to PO pain medications. Will increase Oxycodone 5-10 mg every 4 hours PRN and continue dilaudid 1 mg every 4 hours PRN. Discussed with patient and nursing need to use oral medications before using dilaudid.  - Magic mouthwash with lidocaine four times daily -  Continue Acyclovir day 3/7 - Continue Fluconazole day 2/3   Dispo: Anticipated discharge in approximately 1-2 day(s) pending improvement in pain control and PO intake.   Levora Dredge, MD 10/09/2018, 8:11 AM

## 2018-10-10 DIAGNOSIS — K121 Other forms of stomatitis: Secondary | ICD-10-CM | POA: Diagnosis not present

## 2018-10-10 DIAGNOSIS — K1230 Oral mucositis (ulcerative), unspecified: Secondary | ICD-10-CM | POA: Diagnosis not present

## 2018-10-10 LAB — BASIC METABOLIC PANEL
Anion gap: 7 (ref 5–15)
BUN: 5 mg/dL — ABNORMAL LOW (ref 6–20)
CO2: 30 mmol/L (ref 22–32)
Calcium: 9.1 mg/dL (ref 8.9–10.3)
Chloride: 99 mmol/L (ref 98–111)
Creatinine, Ser: 0.78 mg/dL (ref 0.44–1.00)
GFR calc Af Amer: >60 mL/min (ref >60)
GFR calc non Af Amer: >60 mL/min (ref >60)
Glucose, Bld: 88 mg/dL (ref 70–99)
Potassium: 4 mmol/L (ref 3.5–5.1)
Sodium: 136 mmol/L (ref 135–145)

## 2018-10-10 LAB — CBC
HCT: 36.4 % (ref 36.0–46.0)
Hemoglobin: 11.2 g/dL — ABNORMAL LOW (ref 12.0–15.0)
MCH: 23.9 pg — ABNORMAL LOW (ref 26.0–34.0)
MCHC: 30.8 g/dL (ref 30.0–36.0)
MCV: 77.8 fL — ABNORMAL LOW (ref 80.0–100.0)
Platelets: 349 10*3/uL (ref 150–400)
RBC: 4.68 MIL/uL (ref 3.87–5.11)
RDW: 12.3 % (ref 11.5–15.5)
WBC: 7.7 10*3/uL (ref 4.0–10.5)
nRBC: 0 % (ref 0.0–0.2)

## 2018-10-10 MED ORDER — LIDOCAINE VISCOUS HCL 2 % MT SOLN
15.0000 mL | OROMUCOSAL | Status: DC | PRN
  Administered 2018-10-10 – 2018-10-13 (×12): 15 mL via OROMUCOSAL
  Filled 2018-10-10 (×12): qty 15

## 2018-10-10 MED ORDER — LACTATED RINGERS IV SOLN
INTRAVENOUS | Status: AC
  Administered 2018-10-10 – 2018-10-11 (×3): via INTRAVENOUS

## 2018-10-10 NOTE — Progress Notes (Signed)
   Subjective: Patient was seen and evaluated at bedside on morning rounds. She still complains of difficulty swallowing due to pain. She has needed to ask for pain medication frequently. She endorses that even with swallowing lidocaine mouthwash, she still has odynophagia. No other acute complaints. Denies any rashes. No other symptoms. She mentions that although she has missed her daughter, she prefers to stay in hospital as long as required to have this problem resolved.  Objective:  Vital signs in last 24 hours: Vitals:   10/09/18 1500 10/09/18 1944 10/10/18 0528 10/10/18 1400  BP: 128/86 124/83 123/74 126/88  Pulse: 72 67 66 66  Resp: 17 18 16 16   Temp: 98.1 F (36.7 C) 99.1 F (37.3 C) 98.6 F (37 C) 98.1 F (36.7 C)  TempSrc: Oral Oral Oral Oral  SpO2: 98% 95% 96% 99%  Weight:      Height:       Physical Exam  Constitutional: She is oriented to person, place, and time. She appears well-developed and well-nourished. No distress.  HEENT: Eyes: EOM are normal.  Ears: mild tenderness on tragusus Mouth exam: Oral ulcers on interior wall as well as white patches. No vesicle is visible but exam is limited due to patient's pain Cardiovascular: Normal rate, regular rhythm, normal heart sounds and intact distal pulses.  No murmur heard today. Pulmonary/Chest: Breath sounds normal. No stridor. She has no wheezes. She has no rales.  Abdominal: Soft. Bowel sounds are normal. She exhibits no distension. There is no tenderness.  Musculoskeletal: Normal range of motion. She exhibits no edema. No skin lesions on palms and soles. Lymphadenopathy:    She has cervical flexible, small adenopathy. (with less tenderness than before) Neurological: She is alert and oriented to person, place, and time.  Skin: No rash noted.  Psychiatric: She has a normal mood and affect. Her behavior is normal. Judgment normal.   Assessment/Plan:  Principal Problem:   Stomatitis and mucositis Active  Problems:   Viral stomatitis  Misty Kelly is a 27 y.o female who presented to the ED with progressive mouth and throat pain of 6-7 days duration. She has had decreased PO intake secondary to the pain and was subsequently admitted for dehydration and pain control.   Viral Stomatitis/Mucosisitis  Still has pain and odynophagia. Has frequently gotten pain medications. Magic mouthwash with Lidocaine 4 times daily , 6 x 1 mg IV Dilaudid and 60 mg Percocet  In last 24 h. Gradual improvement despite IV antiviral and antifungal treatment.  GC/clamidia came back negative  -Talked to her ENT physician, Dr. Pollyann Kennedy. He will see the patient today. Appreciate his recommendation. -C/W Dilaudid and Percocet -Switch Magic mouth wash w Lidocaine to Lidocaine mouth solution -c/w IV Acyclovir (started on 11/8) and Fluconazole (Started on 11/9). May DC Fluconazole tomorrow) -LR 100 ml/h x 24 h and then reevaluate PO intake   Dispo: Gradual Clinical improvement. Anticipated discharge in approximately 2-3  Day depending on pain control and being able to eat and drink  Chevis Pretty, MD 10/10/2018, 2:13 PM Pager: [phone]

## 2018-10-10 NOTE — Consult Note (Signed)
  I saw her last week with a severe viral stomatitis.  I have recommended admission to the hospital but she wanted to try it at home.  The biggest concern was dehydration from inability to drink adequate amounts of liquids.  She was subsequently admitted to the hospital.  She has been on IV antivirals and antifungals.  Her pain has spread and gotten worse.  On exam, she is resting quietly.  She had pain medicine recently.  She is easily arousable and able to communicate.  On examination, the stomatitis appears to have progressed along the lateral tongue on both sides.  Is also progressing posteriorly down the buccal mucosa.  The anterior lip lesions appear to be slightly better.  Viral stomatitis.  Continue antivirals.  Continue to monitor.  I am available if any additional issues arise.

## 2018-10-11 DIAGNOSIS — K121 Other forms of stomatitis: Secondary | ICD-10-CM | POA: Diagnosis not present

## 2018-10-11 DIAGNOSIS — K1239 Other oral mucositis (ulcerative): Secondary | ICD-10-CM

## 2018-10-11 DIAGNOSIS — K1230 Oral mucositis (ulcerative), unspecified: Secondary | ICD-10-CM | POA: Diagnosis not present

## 2018-10-11 MED ORDER — FLUCONAZOLE 100 MG PO TABS
200.0000 mg | ORAL_TABLET | Freq: Every day | ORAL | Status: DC
  Administered 2018-10-11 – 2018-10-13 (×3): 200 mg via ORAL
  Filled 2018-10-11 (×3): qty 2

## 2018-10-11 NOTE — Progress Notes (Signed)
  Date: 10/11/2018  Patient name: Misty Kelly  Medical record number: [id number]  Date of birth: 1991-01-26   I have seen and evaluated this patient and I have discussed the plan of care with the house staff. Please see their note for complete details.  Resident note to follow.  Significant improvement over the past 24 hours, now able to drink some and having some bites of food.  Unfortunately, drank some orange juice this morning, which burned her mouth.  Tongue remains most painful.  We will continue therapies for her, transition to oral medications, ensure she can maintain adequate hydration on her own, and plan for hopeful discharge tomorrow.  Appreciate repeat evaluation by ENT, who recommended continuing the course of care.  Jessy OtoAlexander Raines, M.D., Ph.D. 10/11/2018, 5:01 PM

## 2018-10-11 NOTE — Progress Notes (Signed)
   Subjective: Patient is doing much better today.  Endorses that she was able to drink.  Also able to eat small bites of her dinner last night.  She says that she can smile without having pain.  And the pain on the wall of her mouth got better.  Objective:  Vital signs in last 24 hours: Vitals:   10/10/18 0528 10/10/18 1400 10/10/18 2355 10/11/18 0544  BP: 123/74 126/88 (!) 130/91 135/81  Pulse: 66 66 69 60  Resp: 16 16    Temp: 98.6 F (37 C) 98.1 F (36.7 C) 98.1 F (36.7 C) 98 F (36.7 C)  TempSrc: Oral Oral Oral Oral  SpO2: 96% 99% 96% 97%  Weight:      Height:       Physical Exam  Constitutional: She is oriented to person, place, and time. She appears well-developed and well-nourished. No distress.   HEENT: Eyes: EOM are normal.  Ulcers on her mouth improved, still has white patches at the lateral side. Cardiovascular: Normal rate, regular rhythm, normal heart sounds and intact distal pulses.  No murmur heard. Pulmonary/Chest: Effort normal and breath sounds normal. She has no wheezes. She has no rales.  Abdominal: Soft. Bowel sounds are normal. She exhibits no distension. There is no tenderness.  Musculoskeletal: She exhibits no edema.  Neurological: She is alert and oriented to person, place, and time. No cranial nerve deficit.  Skin: Skin is warm and dry. No pallor.  Psychiatric: She has a normal mood and affect. Her behavior is normal. Judgment and thought content normal.   Assessment/Plan:  Principal Problem:   Stomatitis and mucositis Active Problems:   Viral stomatitis  Misty Kelly is a 27 y.o female who presented to the ED with progressive mouth and throat pain of 6-7 days duration. She has had decreased PO intake secondary to the pain and was subsequently admitted for dehydration and pain control.   Viral Stomatitis/Mucosisitis  She is significantly improved since yesterday.  And was able to drink and eat small bites of food.  In has needed less frequent  pain medicine. ENT saw her yesterday and recommended to continue current plan with antiviral -Continue IV acyclovir (started on 11/8) -Switch IV fluconazole (started on 11/9) to p.o. Fluconazole -Continue lidocaine mouthwash as needed -Continue Dilaudid and Percocet as needed   Dispo: Anticipated discharge tomorrow  Chevis Pretty, MD 10/11/2018, 6:16 AM Pager: [phone]

## 2018-10-12 DIAGNOSIS — K1230 Oral mucositis (ulcerative), unspecified: Secondary | ICD-10-CM | POA: Diagnosis not present

## 2018-10-12 DIAGNOSIS — K121 Other forms of stomatitis: Secondary | ICD-10-CM | POA: Diagnosis not present

## 2018-10-12 LAB — BASIC METABOLIC PANEL
Anion gap: 6 (ref 5–15)
BUN: 8 mg/dL (ref 6–20)
CO2: 28 mmol/L (ref 22–32)
Calcium: 9.4 mg/dL (ref 8.9–10.3)
Chloride: 102 mmol/L (ref 98–111)
Creatinine, Ser: 0.87 mg/dL (ref 0.44–1.00)
GFR calc Af Amer: >60 mL/min (ref >60)
GFR calc non Af Amer: >60 mL/min (ref >60)
Glucose, Bld: 93 mg/dL (ref 70–99)
Potassium: 4.1 mmol/L (ref 3.5–5.1)
Sodium: 136 mmol/L (ref 135–145)

## 2018-10-12 NOTE — Care Management Note (Signed)
Case Management Note  Patient Details  Name: Omar Personrush Zapf MRN: [id number] Date of Birth: 03-21-1991  Subjective/Objective:  Presents with viral stomatitis, mouth discomfort,hx of alpha-thalassemia,  and urinary incontinence , 2 months postpartum. Independent with ADL's , no DME usage.       Domonic Germain OsgoodBrit [employer]) Fredda Hammedve Pangting (Sister)    [phone]     [facility]  Action/Plan: Transition to home when medically ready... NCM follow for TOC needs.  Expected Discharge Date:                  Expected Discharge Plan:  Home/Self Care  In-House Referral:     Discharge planning Services  CM Consult  Post Acute Care Choice:    Choice offered to:     DME Arranged:    DME Agency:     HH Arranged:    HH Agency:     Status of Service:  In process, will continue to follow  If discussed at Long Length of Stay Meetings, dates discussed:    Additional Comments:  Epifanio LeschesCole, Dellene Mcgroarty Hudson, RN 10/12/2018, 8:11 AM

## 2018-10-12 NOTE — Progress Notes (Signed)
   Subjective: Patient was seen and evaluated today.  She endorses that she noticed some swelling at inside of her mouth at left side because she bites it when she tries to eat. Drinking is okay and she was able to drink some soup but she still needs to ask for pain medicine frequently.  Objective:  Vital signs in last 24 hours: Vitals:   10/11/18 1337 10/11/18 2052 10/12/18 0521 10/12/18 1324  BP: (!) 158/98 (!) 141/87 122/73 135/83  Pulse: 78 70 76 65  Resp: 18 16 16 18   Temp: 98.3 F (36.8 C) 98.2 F (36.8 C) 98.1 F (36.7 C) 98.6 F (37 C)  TempSrc: Oral Oral Oral Oral  SpO2: 98% 95% 97% 95%  Weight:      Height:       Physical Exam  Constitutional: She is oriented to person, place, and time. She appears well-developed and well-nourished.  Eyes: EOM are normal.  Neck: No tracheal deviation present. No thyromegaly present.  Oral cavity: ulcer on oral mucosa improved but still has the white patch on interior wall of mouth Cardiovascular: Normal rate, regular rhythm, normal heart sounds and intact distal pulses.  No murmur heard. Pulmonary/Chest: Effort normal and breath sounds normal. No respiratory distress. She has no wheezes. She has no rales.  Abdominal: Soft. Bowel sounds are normal. There is no tenderness.  Musculoskeletal: Normal range of motion. She exhibits no edema.  Neurological: She is alert and oriented to person, place, and time.  Skin: Skin is warm and dry.  Psychiatric: She has a normal mood and affect. Her behavior is normal. Judgment and thought content normal.   Assessment/Plan:  Principal Problem:   Stomatitis and mucositis Active Problems:   Viral stomatitis  Mee Hivesrush Misty Kelly is a 27 y.o female who presented to the ED with progressive mouth and throat pain of 6-7 days duration. She has had decreased PO intake secondary to the pain and was subsequently admitted for dehydration and pain control.   Viral Stomatitis/Mucosisitis She overaly improved. But  still requires frequent pain medications.  Able to drink and eat soft food or small bites if pain controls. -Continue IV acyclovir (started on 11/8) -C/w p.o. Fluconazole) On Fluconazole since 11/9 -Continue lidocaine mouthwash as needed -Continue Dilaudid and Percocet as needed. Plan to stop IV Dilaudid tomorrow and increase PO pain meds to discharge if pain controlled. (may switch Percocet to alternating Oxy codon 15 and Tylenol to avoid tylenol toxicity)  Dispo: Anticipated discharge in approximately tomorrow  Chevis PrettyMasoudi, Gregorio Worley, MD 10/12/2018, 3:47 PM Pager: [phone]

## 2018-10-13 MED ORDER — FLUCONAZOLE 200 MG PO TABS: 200.0000 mg | ORAL_TABLET | Freq: Every day | ORAL | 0 refills | Status: AC

## 2018-10-13 MED ORDER — LIDOCAINE VISCOUS HCL 2 % MT SOLN: 15.0000 mL | OROMUCOSAL | 0 refills | Status: DC | PRN

## 2018-10-13 MED ORDER — OXYCODONE-ACETAMINOPHEN 5-325 MG PO TABS: 1.0000 | ORAL_TABLET | Freq: Four times a day (QID) | ORAL | 0 refills | Status: AC | PRN

## 2018-10-13 NOTE — Progress Notes (Signed)
Nsg Discharge Note  Admit Date:  10/07/2018 Discharge date: 10/13/2018   Omar Personrush Thune to be D/C'd Home per MD order.  AVS completed.  Copy for chart, and copy for patient signed, and dated. Patient/caregiver able to verbalize understanding.  Discharge Medication: Allergies as of 10/13/2018   No Known Allergies     Medication List    STOP taking these medications   acetaminophen 500 MG tablet Commonly known as:  TYLENOL   amoxicillin-clavulanate 875-125 MG tablet Commonly known as:  AUGMENTIN   HYDROcodone-acetaminophen 7.5-325 mg/15 ml solution Commonly known as:  HYCET   magic mouthwash w/lidocaine Soln     TAKE these medications   acyclovir 400 MG tablet Commonly known as:  ZOVIRAX Take 400 mg by mouth 3 (three) times daily.   Ferrous Fumarate 324 (106 Fe) MG Tabs tablet Commonly known as:  HEMOCYTE - 106 mg FE Take 1 tablet (106 mg of iron total) by mouth 2 (two) times daily.   fluconazole 200 MG tablet Commonly known as:  DIFLUCAN Take 1 tablet (200 mg total) by mouth daily for 5 days.   ibuprofen 600 MG tablet Commonly known as:  ADVIL,MOTRIN Take 1 tablet (600 mg total) by mouth every 6 (six) hours as needed.   lidocaine 2 % solution Commonly known as:  XYLOCAINE Use as directed 15 mLs in the mouth or throat every 4 (four) hours as needed for mouth pain.   oxyCODONE-acetaminophen 5-325 MG tablet Commonly known as:  PERCOCET/ROXICET Take 1-2 tablets by mouth every 6 (six) hours as needed for up to 5 days for moderate pain.       Discharge Assessment: Vitals:   10/12/18 2131 10/13/18 0522  BP: 113/70 108/72  Pulse: 63 62  Resp: 18 16  Temp: 97.7 F (36.5 C) 98.1 F (36.7 C)  SpO2: 98% 98%   Skin clean, dry and intact without evidence of skin break down, no evidence of skin tears noted. IV catheter discontinued intact. Site without signs and symptoms of complications - no redness or edema noted at insertion site, patient denies c/o pain - only slight  tenderness at site.  Dressing with slight pressure applied.  D/c Instructions-Education: Discharge instructions given to patient/family with verbalized understanding. D/c education completed with patient/family including follow up instructions, medication list, d/c activities limitations if indicated, with other d/c instructions as indicated by MD - patient able to verbalize understanding, all questions fully answered. Patient instructed to return to ED, call 911, or call MD for any changes in condition.  Patient escorted via WC, and D/C home via private auto.  [place] Krayton Wortley, RN 10/13/2018 11:32 AM

## 2018-10-13 NOTE — Progress Notes (Addendum)
   Subjective: Patient was seen and evaluated at bedside on morning rounds. No acute events overnight. She is feeling well, endorses that she needed less pain meds yesterday and could eat some soft food. She is willing to go home. We talk about plan of care after discharge and she understands and agrees.  Objective:  Vital signs in last 24 hours: Vitals:   10/12/18 0521 10/12/18 1324 10/12/18 2131 10/13/18 0522  BP: 122/73 135/83 113/70 108/72  Pulse: 76 65 63 62  Resp: 16 18 18 16   Temp: 98.1 F (36.7 C) 98.6 F (37 C) 97.7 F (36.5 C) 98.1 F (36.7 C)  TempSrc: Oral Oral Oral Oral  SpO2: 97% 95% 98% 98%  Weight:      Height:      Physical Exam  Constitutional: She is oriented to person, place, and time and well-developed, well-nourished, and in no distress. No distress.  HENT:   Oral cavity: Ulcer on oral mucosa is white patch on anterior wall of mouth present but improved. Cardiovascular: Normal rate, regular rhythm and normal heart sounds.  No murmur heard. Pulmonary/Chest: Effort normal and breath sounds normal. She has no wheezes. She has no rales.  Abdominal: Soft. Bowel sounds are normal. She exhibits no distension. There is no tenderness.  Musculoskeletal: She exhibits no edema or tenderness.  Neurological: She is alert and oriented to person, place, and time.  Skin: Skin is warm and dry. No rash noted. No pallor.  Psychiatric: Mood, memory, affect and judgment normal.    Assessment/Plan:  Principal Problem:   Stomatitis and mucositis Active Problems:   Viral stomatitis  Mee Hivesrush Ricki Millerang is a 27 y.o female who presented to the ED with progressive mouth and throat pain of 6-7 days duration. She has had decreased PO intake secondary to the pain and was subsequently admitted for dehydration and pain control.   Viral Stomatitis/Mucosisitis Improved significantly. Decreased pain medication requirement. Pain mainly controlled with PO Percocet q6 h. Able to drinkand eat  soft food  -D/c home to continue PO Fluconazol x 5 more days  -Continue POacyclovir and follow up with Dr. Pollyann Kennedyosen to decide about duration of Tx. -Continue lidocaine mouthwash as needed -We dont increase Percocet dose for home, as the pain is controlled with current dose since yesterday. Percocet 5-325 mg 1-2 tablet as needed x 5 days for moderate pain and ibuprofen 600 mg every 6 hours as needed for mild to moderate pain. -Patient provided information about close oral contact precaution for her new born and family  Dispo: Discharge home today  Chevis PrettyMasoudi, Dereonna Lensing, MD 10/13/2018, 8:36 AM Pager: [phone]

## 2018-10-13 NOTE — Discharge Summary (Addendum)
Name: Misty Kelly MRN: [id number] DOB: Dec 01, 1990 27 y.o. PCP: Department, [facility]  Date of Admission: 10/07/2018 10:07 AM Date of Discharge: 10/13/2018 Attending Physician: Anne Shutter, MD  Discharge Diagnosis: 1.  Viral stomatitis and mucositis  Discharge Medications: Allergies as of 10/13/2018   No Known Allergies     Medication List    STOP taking these medications   acetaminophen 500 MG tablet Commonly known as:  TYLENOL   amoxicillin-clavulanate 875-125 MG tablet Commonly known as:  AUGMENTIN   HYDROcodone-acetaminophen 7.5-325 mg/15 ml solution Commonly known as:  HYCET   magic mouthwash w/lidocaine Soln     TAKE these medications   acyclovir 400 MG tablet Commonly known as:  ZOVIRAX Take 400 mg by mouth 3 (three) times daily.   Ferrous Fumarate 324 (106 Fe) MG Tabs tablet Commonly known as:  HEMOCYTE - 106 mg FE Take 1 tablet (106 mg of iron total) by mouth 2 (two) times daily.   fluconazole 200 MG tablet Commonly known as:  DIFLUCAN Take 1 tablet (200 mg total) by mouth daily for 5 days.   ibuprofen 600 MG tablet Commonly known as:  ADVIL,MOTRIN Take 1 tablet (600 mg total) by mouth every 6 (six) hours as needed.   lidocaine 2 % solution Commonly known as:  XYLOCAINE Use as directed 15 mLs in the mouth or throat every 4 (four) hours as needed for mouth pain.   oxyCODONE-acetaminophen 5-325 MG tablet Commonly known as:  PERCOCET/ROXICET Take 1-2 tablets by mouth every 6 (six) hours as needed for up to 5 days for moderate pain.       Disposition and follow-up:   Misty Kelly was discharged from [facility] in Stable condition.  At the hospital follow up visit please address:  1.  Patient presented with painful ulcer on her mouth and difficulty drinking and eating.  Treated with IV acyclovir and IV fluconazole as well as IV fluid. On discharge, still has some white patches inside her mouth but the ulcers  improved significantly. We discharged her to continue p.o. fluconazole for 5 more days and continue acyclovir p.o. until follow-up with ENT physician Dr. Pollyann Kennedy.  Please evaluate her oral ulcers at follow-up visit.  2.  Labs / imaging needed at time of follow-up: None  3.  Pending labs/ test needing follow-up: None  Follow-up Appointments: Follow-up Information    Department, [facility]. Schedule an appointment as soon as possible for a visit in 1 week(s).   Contact information: [address] [place] [address] [phone]        Serena Colonel, MD. Call in 2 day(s).   Specialty:  Otolaryngology Contact information: [address] [place] [address] [phone]           Hospital Course by problem list: 1. Misty Kelly, is a 27 y/o female, 2 months post partum, presented with painful ulcer on her mouth and difficulty drinking and eating.  She had not responded to p.o. antibiotic and oral acyclovir and pain medicine that prescribed for her on urgent care and by ENTs before arrival to the ED. After admission, we treated her with IV acyclovir for 6 days and IV fluconazole for 4 days as well as IV fluid. ENT, Dr. Pollyann Kennedy, consulted and so the patient and agreed to continue current treatment.  Patient has been afebrile with normal white count.  She had slow improvement initially and needed frequent p.o. and IV pain medications. However, she  improved significantly in the last 2 days before discharge and was able to maintain her own hydration and begin eating again.  She was discharged to continue with 5 days of fluconazole and acyclovir as well as viscous lidocaine and percocet as needed for pain.  Discharge Vitals:   BP 108/72 (BP Location: Right Arm)   Pulse 62   Temp 98.1 F (36.7 C) (Oral)   Resp 16   Ht 5\' 5"  (1.651 m)   Wt 79.4 kg   LMP 09/28/2018   SpO2 98%   BMI 29.12 kg/m   Pertinent Labs, Studies, and Procedures:  Group A strep by PCR:  Not detected Mononucleosis screen: Negative HIV: Nonreactive  Discharge Instructions: Discharge Instructions    Diet - low sodium heart healthy   Complete by:  As directed    Discharge instructions   Complete by:  As directed    Thanks for allowing Misty Kelly [facility].  You came to the hospital due to ulcers inside of your mouth and difficulty eating and drinking .  We think it was viral infection as well as yeast infection.  We treated you with antiviral medications, and antifungal medication for yeast infection also controlled your pain and gave you IV fluid. We are glad that you feel better.  After discharge, please continue prescribed medication as instructed: Fluconazole 1 tablet daily for 5 more days, Percocet and lidocaine mouthwash as needed for pain for 5 days.  Also continue acyclovir and talk to Dr. Pollyann Kennedyosen, (ENT) in next few days for further guide regarding duration of treatment. We can call Misty Kelly back at [phone] having any questions or concern.  Thank you, Dr. Maryla MorrowMasoudi   Increase activity slowly   Complete by:  As directed       Signed: Chevis PrettyMasoudi, Elhamalsadat, MD 10/13/2018, 9:06 AM   Pager: [phone]  Internal Medicine Attending Note:  I saw and examined the patient on the day of discharge. I reviewed and agree with the discharge summary written by the house staff.  Jessy OtoAlexander Raines, M.D., Ph.D.

## 2019-08-22 ENCOUNTER — Ambulatory Visit: Payer: BLUE CROSS/BLUE SHIELD | Admitting: Family Medicine

## 2019-08-30 ENCOUNTER — Other Ambulatory Visit: Payer: Self-pay

## 2019-08-30 DIAGNOSIS — Z20822 Contact with and (suspected) exposure to covid-19: Secondary | ICD-10-CM

## 2019-08-31 ENCOUNTER — Telehealth: Payer: Self-pay | Admitting: *Deleted

## 2019-08-31 LAB — NOVEL CORONAVIRUS, NAA: SARS-CoV-2, NAA: NOT DETECTED

## 2019-08-31 NOTE — Telephone Encounter (Signed)
Negative COVID results given. Patient results "NOT Detected." Caller expressed understanding. ° °

## 2019-09-19 ENCOUNTER — Ambulatory Visit: Payer: BLUE CROSS/BLUE SHIELD | Admitting: Student in an Organized Health Care Education/Training Program

## 2019-12-29 IMAGING — CT CT NECK W/ CM
1 series · 12 of 14 positions shown, 15 images · IV contrast (APPLIED)
Comparison: None.

CLINICAL DATA: Sore throat for 3 days.  Inability to swallow.

EXAM:
CT NECK WITH CONTRAST
TECHNIQUE: Multidetector CT imaging of the neck was performed using the
standard protocol following the bolus administration of intravenous
contrast.
CONTRAST:  75mL OMNIPAQUE IOHEXOL 300 MG/ML  SOLN

[Series 3: neck 2.0 i31s 3 · axial · 0.48mm/px · z∈[-213,-37]mm · 12 of 106 slices shown, 15 images]
[im 9/106  soft-tissue]
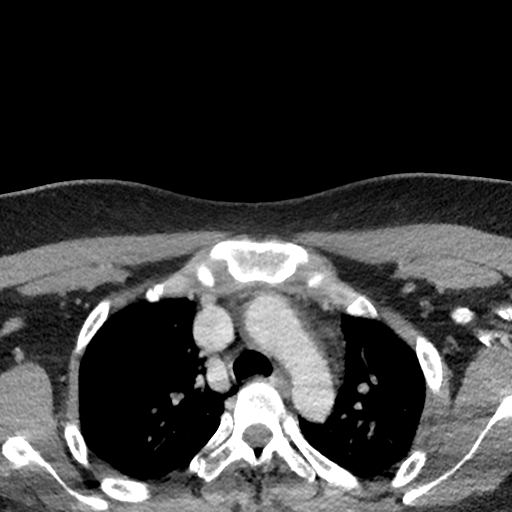
[im 9/106  bone]
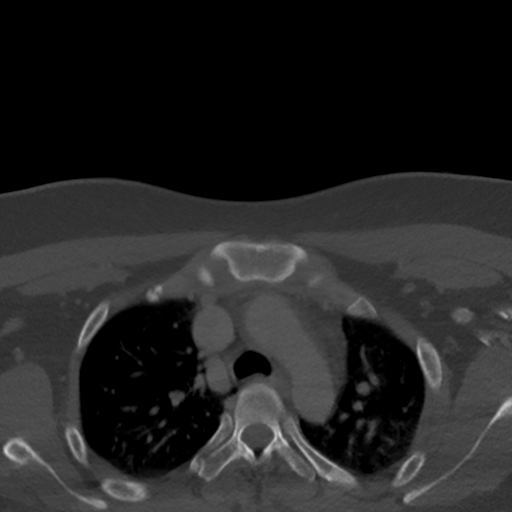
[im 17/106  bone]
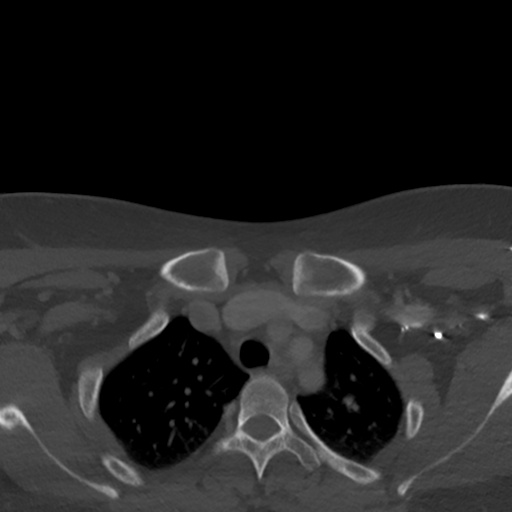
[im 25/106  bone]
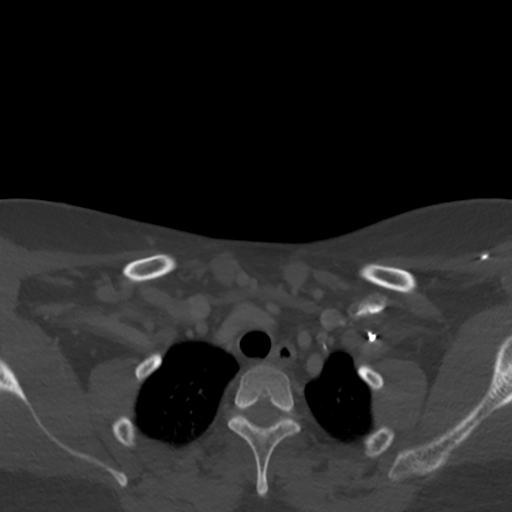
[im 33/106  bone]
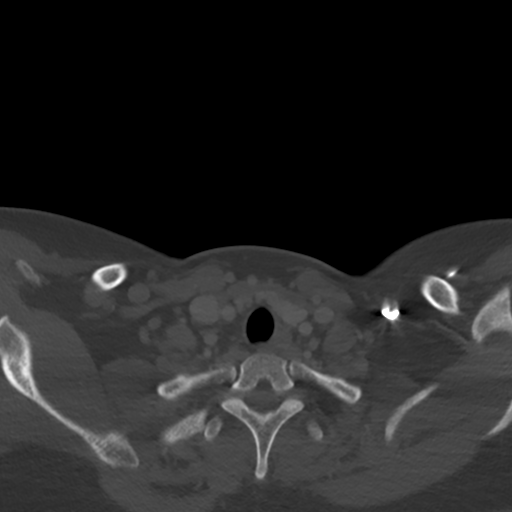
[im 41/106  soft-tissue]
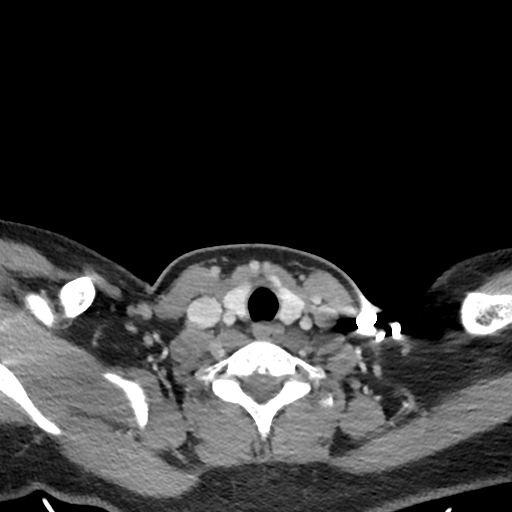
[im 41/106  bone]
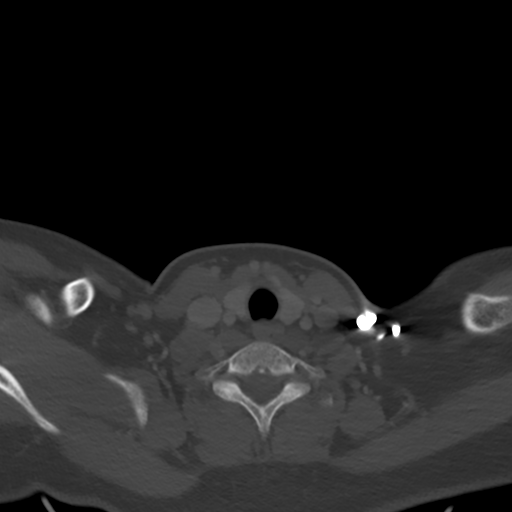
[im 49/106  bone]
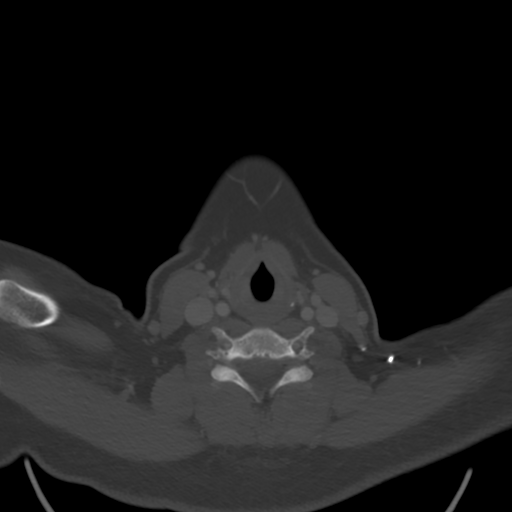
[im 57/106  bone]
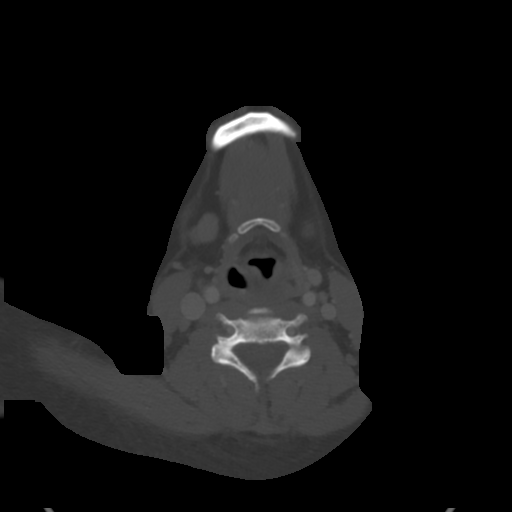
[im 65/106  bone]
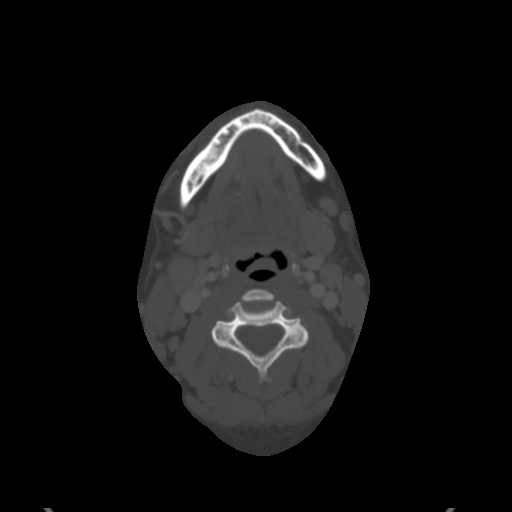
[im 73/106  soft-tissue]
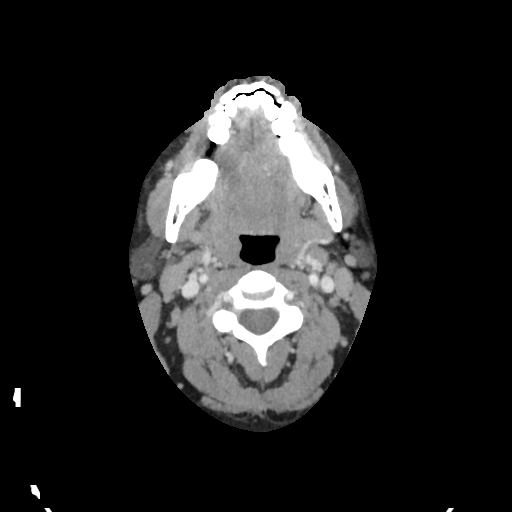
[im 73/106  bone]
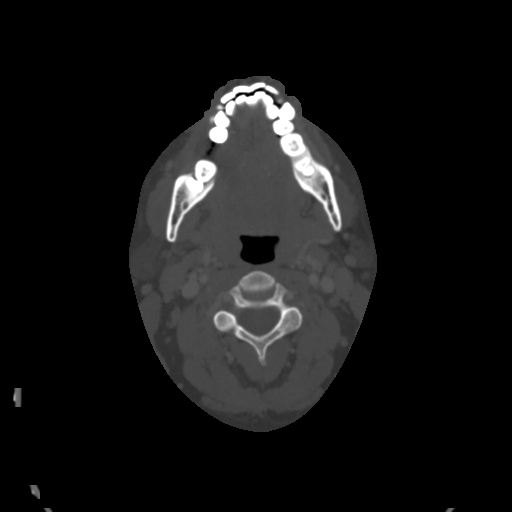
[im 81/106  bone]
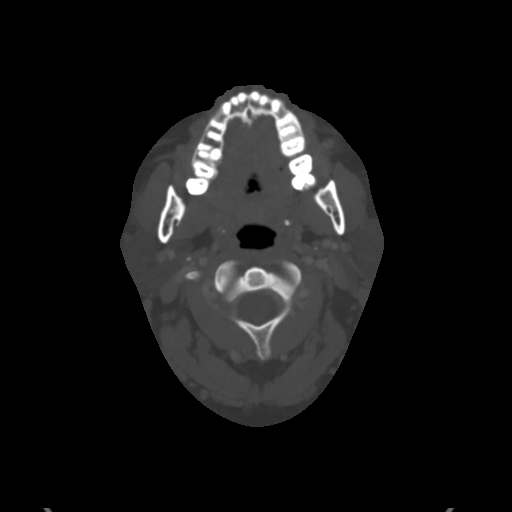
[im 89/106  bone]
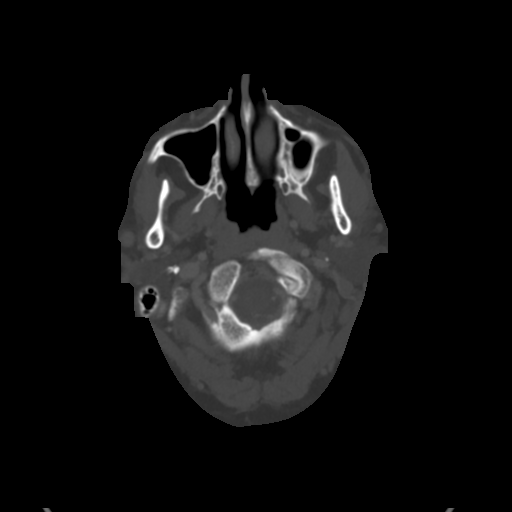
[im 97/106  bone]
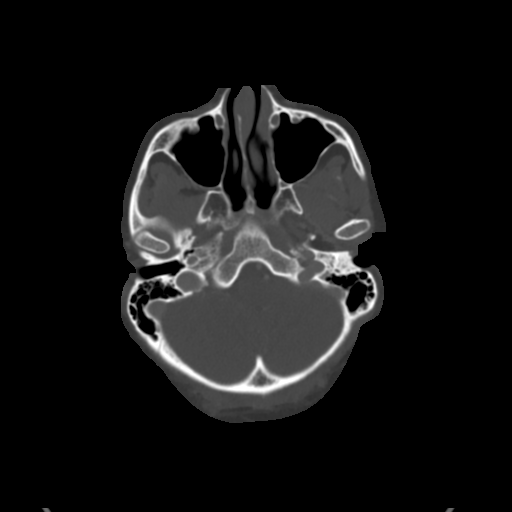

[12 of 14 positions shown; findings below may reference images not displayed]

FINDINGS: Pharynx and larynx: On axial slices the epiglottis has a prominent
thickness, but there is no confirmed thickening on sagittal
reformats, nor is there altered density or overlying mucosal
hyperenhancement. Aryrpiglottic folds are also non thickened. No
airway narrowing or abscess. No retropharyngeal edema. Tonsils are
not clearly enlarged. There are bilateral palatine tonsilliths.

Salivary glands: Nodule in the superficial right parotid attributed
to lymph node.

Thyroid: Normal

Lymph nodes: Enlarged upper jugular and submandibular lymph nodes
with mild adjacent fat stranding. No nodal cavitation.

Vascular: Negative.  Major venous structures are patent

Limited intracranial: Negative

Visualized orbits: Negative

Mastoids and visualized paranasal sinuses: Clear

Skeleton: Negative

Upper chest: Negative
IMPRESSION: 1. Cervical adenitis.
2. No abscess or airway narrowing.

## 2020-03-21 ENCOUNTER — Ambulatory Visit (HOSPITAL_COMMUNITY)
Admission: EM | Admit: 2020-03-21 | Discharge: 2020-03-21 | Disposition: A | Discharge status: Home / Self Care | Payer: Medicaid Other | Attending: Family Medicine | Admitting: Family Medicine

## 2020-03-21 ENCOUNTER — Encounter (HOSPITAL_COMMUNITY): Payer: Self-pay

## 2020-03-21 ENCOUNTER — Other Ambulatory Visit: Payer: Self-pay

## 2020-03-21 DIAGNOSIS — Z1152 Encounter for screening for COVID-19: Secondary | ICD-10-CM

## 2020-03-21 DIAGNOSIS — Z20822 Contact with and (suspected) exposure to covid-19: Secondary | ICD-10-CM | POA: Diagnosis present

## 2020-03-21 NOTE — ED Provider Notes (Signed)
[place]    CSN: [id number] Arrival date & time: 03/21/20  1118      History   Chief Complaint Chief Complaint  Patient presents with  . COVID test    HPI Misty Kelly is a 29 y.o. female.   Patient is a 29 year old female presents today for Covid testing.  Reporting positive family exposure.  She currently has no symptoms.  Here with daughter that has fever and runny nose     Past Medical History:  Diagnosis Date  . Allergy   . STD (sexually transmitted disease) 01/2014   Tx'd for Chlamydia--did have neg. test of cure  . Urinary incontinence     Patient Active Problem List   Diagnosis Date Noted  . Viral stomatitis   . Stomatitis and mucositis 10/07/2018  . Postpartum anemia 07/16/2018  . Obstetric vaginal laceration with type 3b third degree perineal laceration 07/16/2018  . Vaginal delivery 07/14/2018  . Postpartum hemorrhage 07/14/2018  . Indication for care in labor or delivery 07/13/2018  . Positive GBS test 07/13/2018  . 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 Abnormal glucose affecting pregnancy 06/10/2018  . Encounter for supervision of other normal pregnancy, third trimester 06/10/2018    Past Surgical History:  Procedure Laterality Date  . BREAST BIOPSY    . wisdom teeth ext      OB History    Gravida  2   Para  1   Term  1   Preterm  0   AB  1   Living  1     SAB  1   TAB  0   Ectopic  0   Multiple  0   Live Births  1            Home Medications    Prior to Admission medications   Medication Sig Start Date End Date Taking? Authorizing Provider  Ferrous Fumarate (HEMOCYTE - 106 MG FE) 324 (106 Fe) MG TABS tablet Take 1 tablet (106 mg of iron total) by mouth 2 (two) times daily. Patient not taking: Reported on 10/07/2018 07/16/18 03/21/20  Myrtis Ser, CNM    Family History Family History  Problem Relation Age of Onset  . Hyperlipidemia Brother   . Hypertension Brother   . Cancer Maternal  Grandfather        Lung Ca  . Stroke Paternal Grandfather   . Cancer Maternal Grandmother 70        Dec Esophogeal Ca  . Hypertension Mother     Social History Social History   Tobacco Use  . Smoking status: Never Smoker  . Smokeless tobacco: Never Used  Substance Use Topics  . Alcohol use: Yes    Alcohol/week: 0.0 standard drinks    Comment: only special occasions  . Drug use: No     Allergies   Patient has no known allergies.   Review of Systems Review of Systems   Physical Exam Triage Vital Signs ED Triage Vitals [03/21/20 1200]  Enc Vitals Group     BP 117/71     Pulse Rate 92     Resp 16     Temp 98 F (36.7 C)     Temp Source Oral     SpO2 98 %     Weight 170 lb (77.1 kg)     Height 5\' 5"  (1.651 m)     Head Circumference      Peak Flow      Pain Score 0  Pain Loc      Pain Edu?      Excl. in GC?    No data found.  Updated Vital Signs BP 117/71   Pulse 92   Temp 98 F (36.7 C) (Oral)   Resp 16   Ht 5\' 5"  (1.651 m)   Wt 170 lb (77.1 kg)   SpO2 98%   BMI 28.29 kg/m   Visual Acuity Right Eye Distance:   Left Eye Distance:   Bilateral Distance:    Right Eye Near:   Left Eye Near:    Bilateral Near:     Physical Exam Vitals and nursing note reviewed.  Constitutional:      General: She is not in acute distress.    Appearance: Normal appearance. She is not ill-appearing, toxic-appearing or diaphoretic.  HENT:     Head: Normocephalic.     Nose: Nose normal.  Eyes:     Conjunctiva/sclera: Conjunctivae normal.  Pulmonary:     Effort: Pulmonary effort is normal.  Musculoskeletal:        General: Normal range of motion.     Cervical back: Normal range of motion.  Skin:    General: Skin is warm and dry.     Findings: No rash.  Neurological:     Mental Status: She is alert.  Psychiatric:        Mood and Affect: Mood normal.      UC Treatments / Results  Labs (all labs ordered are listed, but only abnormal results are  displayed) Labs Reviewed  SARS CORONAVIRUS 2 (TAT 6-24 HRS)    EKG   Radiology No results found.  Procedures Procedures (including critical care time)  Medications Ordered in UC Medications - No data to display  Initial Impression / Assessment and Plan / UC Course  I have reviewed the triage vital signs and the nursing notes.  Pertinent labs & imaging results that were available during my care of the patient were reviewed by me and considered in my medical decision making (see chart for details).     Covid testing Swab sent for testing advised pending Final Clinical Impressions(s) / UC Diagnoses   Final diagnoses:  Encounter for screening laboratory testing for COVID-19 virus     Discharge Instructions     You can check your my chart for results.     ED Prescriptions    None     PDMP not reviewed this encounter.   , NP 03/21/20 1436

## 2020-03-21 NOTE — Discharge Instructions (Addendum)
You can check your my chart for results.  

## 2020-03-21 NOTE — ED Triage Notes (Addendum)
Pt wants COVID test, because she has been around the family member that was around COVID + people. Pt denies symptoms at this time.

## 2020-03-22 LAB — SARS CORONAVIRUS 2 (TAT 6-24 HRS): SARS Coronavirus 2: NEGATIVE

## 2020-04-06 ENCOUNTER — Ambulatory Visit (HOSPITAL_COMMUNITY)
Admission: EM | Admit: 2020-04-06 | Discharge: 2020-04-06 | Disposition: A | Discharge status: Home / Self Care | Payer: Medicaid Other | Attending: Family Medicine | Admitting: Family Medicine

## 2020-04-06 ENCOUNTER — Other Ambulatory Visit: Payer: Self-pay

## 2020-04-06 ENCOUNTER — Ambulatory Visit (INDEPENDENT_AMBULATORY_CARE_PROVIDER_SITE_OTHER): Payer: Medicaid Other

## 2020-04-06 ENCOUNTER — Encounter (HOSPITAL_COMMUNITY): Payer: Self-pay

## 2020-04-06 DIAGNOSIS — R0981 Nasal congestion: Secondary | ICD-10-CM | POA: Diagnosis not present

## 2020-04-06 DIAGNOSIS — Z20822 Contact with and (suspected) exposure to covid-19: Secondary | ICD-10-CM | POA: Diagnosis not present

## 2020-04-06 DIAGNOSIS — R05 Cough: Secondary | ICD-10-CM

## 2020-04-06 DIAGNOSIS — R079 Chest pain, unspecified: Secondary | ICD-10-CM

## 2020-04-06 DIAGNOSIS — R059 Cough, unspecified: Secondary | ICD-10-CM

## 2020-04-06 LAB — SARS CORONAVIRUS 2 (TAT 6-24 HRS): SARS Coronavirus 2: NEGATIVE

## 2020-04-06 MED ORDER — BENZONATATE 100 MG PO CAPS: ORAL_CAPSULE | ORAL | 0 refills | Status: DC

## 2020-04-06 NOTE — Discharge Instructions (Addendum)
You have been tested for COVID-19 today. °If your test returns positive, you will receive a phone call from [facility] regarding your results. °Negative test results are not called. °Both positive and negative results area always visible on MyChart. °If you do not have a MyChart account, sign up instructions are provided in your discharge papers. °Please do not hesitate to contact us should you have questions or concerns. ° °

## 2020-04-06 NOTE — ED Provider Notes (Signed)
[facility]   [phone] 04/06/20 Arrival Time: [facility] PLAN:  1. Cough   2. Chest pain, unspecified type     I have personally viewed the imaging studies ordered this visit. Normal CXR. No PNA. No pneumothorax.   Question allergies vs early viral illness. Discussed. COVID-19 testing sent. See letter/work note on file for self-isolation guidelines.   Meds ordered this encounter  Medications  . benzonatate (TESSALON) 100 MG capsule    Sig: Take 1 capsule by mouth every 8 (eight) hours for cough.    Dispense:  21 capsule    Refill:  0     Follow-up [facility].   Specialty: Urgent Care Why: If worsening or failing to improve as anticipated. Contact information: [place] [phone]          Reviewed expectations re: course of current medical issues. Questions answered. Outlined signs and symptoms indicating need for more acute intervention. Understanding verbalized. After Visit Summary given.   SUBJECTIVE: History from: patient. Shandrika Ambers is a 29 y.o. female who requests COVID-19 testing. Known COVID-19 contact: none. Recent travel: none. Denies: fever, difficulty breathing and headache. Reports nasal congestion, mild ST, coughing for the past several days. Chest pain with coughing. Normal PO intake without n/v/d. H/O seasonal allergies. Non-smoker.   OBJECTIVE:  Vitals:   04/06/20 1249  BP: 120/86  Pulse: 79  Resp: 18  Temp: 98.3 F (36.8 C)  TempSrc: Oral  SpO2: 98%  Weight: 77.1 kg  Height: 5\' 5"  (1.651 m)    General appearance: alert; no distress Eyes: PERRLA; EOMI; conjunctiva normal HENT: [place]; AT; nasal congestion Neck: supple  Lungs: speaks full sentences without difficulty; unlabored; CTAB; dry cough Extremities: no edema Skin: warm and dry Neurologic: normal gait Psychological: alert and cooperative; normal mood and  affect  Labs:  Labs Reviewed  SARS CORONAVIRUS 2 (TAT 6-24 HRS)    Imaging: DG Chest 2 View  Result Date: 04/06/2020 CLINICAL DATA:  Cough and chest pain EXAM: CHEST - 2 VIEW COMPARISON:  May 21, 2015 FINDINGS: The lungs are clear. The heart size and pulmonary vascularity are normal. No adenopathy. No pneumothorax. No bone lesions. IMPRESSION: Lungs clear.  Cardiac silhouette within normal limits. Electronically Signed   By: Lowella Grip III M.D.   On: 04/06/2020 13:39    No Known Allergies  Past Medical History:  Diagnosis Date  . Allergy   . STD (sexually transmitted disease) 01/2014   Tx'd for Chlamydia--did have neg. test of cure  . Urinary incontinence    Social History   Socioeconomic History  . Marital status: Significant Other    Spouse name: Not on file  . Number of children: Not on file  . Years of education: Not on file  . Highest education level: Not on file  Occupational History  . Not on file  Tobacco Use  . Smoking status: Never Smoker  . Smokeless tobacco: Never Used  Substance and Sexual Activity  . Alcohol use: Yes    Alcohol/week: 0.0 standard drinks    Comment: only special occasions  . Drug use: No  . Sexual activity: Yes    Partners: Male    Birth control/protection: None  Other Topics Concern  . Not on file  Social History Narrative  . Not on file   Social Determinants of Health   Financial Resource Strain:   . Difficulty of Paying  Living Expenses:   Food Insecurity:   . Worried About Programme researcher, broadcasting/film/video in the Last Year:   . Barista in the Last Year:   Transportation Needs:   . Freight forwarder (Medical):   Marland Kitchen Lack of Transportation (Non-Medical):   Physical Activity:   . Days of Exercise per Week:   . Minutes of Exercise per Session:   Stress:   . Feeling of Stress :   Social Connections:   . Frequency of Communication with Friends and Family:   . Frequency of Social Gatherings with Friends and Family:   .  Attends Religious Services:   . Active Member of Clubs or Organizations:   . Attends Banker Meetings:   Marland Kitchen Marital Status:   Intimate Partner Violence:   . Fear of Current or Ex-Partner:   . Emotionally Abused:   Marland Kitchen Physically Abused:   . Sexually Abused:    Family History  Problem Relation Age of Onset  . Hyperlipidemia Brother   . Hypertension Brother   . Cancer Maternal Grandfather        Lung Ca  . Stroke Paternal Grandfather   . Cancer Maternal Grandmother 16        Dec Esophogeal Ca  . Hypertension Mother    Past Surgical History:  Procedure Laterality Date  . BREAST BIOPSY    . wisdom teeth ext        Mardella Layman, MD 04/06/20 1446

## 2020-04-06 NOTE — ED Triage Notes (Signed)
Pt c/o nasal congestion, sore throat, dry cough, 7/10 sharp non radiating midsternal chest painx3 days. Pt c/o heaviness in chest when she coughs. Pt states the pain comes and goes in chest. Pt has non labored breathing. Skin color WNL.

## 2021-12-02 ENCOUNTER — Encounter (HOSPITAL_COMMUNITY): Payer: Self-pay | Admitting: Emergency Medicine

## 2021-12-02 ENCOUNTER — Other Ambulatory Visit: Payer: Self-pay

## 2021-12-02 ENCOUNTER — Ambulatory Visit (HOSPITAL_COMMUNITY)
Admission: EM | Admit: 2021-12-02 | Discharge: 2021-12-02 | Disposition: A | Discharge status: Home / Self Care | Payer: Medicaid Other | Attending: Emergency Medicine | Admitting: Emergency Medicine

## 2021-12-02 DIAGNOSIS — Z20822 Contact with and (suspected) exposure to covid-19: Secondary | ICD-10-CM | POA: Diagnosis present

## 2021-12-02 DIAGNOSIS — R051 Acute cough: Secondary | ICD-10-CM | POA: Diagnosis present

## 2021-12-02 MED ORDER — BENZONATATE 200 MG PO CAPS: 200.0000 mg | ORAL_CAPSULE | Freq: Three times a day (TID) | ORAL | 0 refills | Status: DC | PRN

## 2021-12-02 MED ORDER — PROMETHAZINE-DM 6.25-15 MG/5ML PO SYRP: 5.0000 mL | ORAL_SOLUTION | Freq: Four times a day (QID) | ORAL | 0 refills | Status: DC | PRN

## 2021-12-02 NOTE — ED Triage Notes (Signed)
Pt had dry cough and sore throat x 3 days.

## 2021-12-02 NOTE — ED Provider Notes (Signed)
HPI  SUBJECTIVE:  Misty Kelly is a 31 y.o. female who presents with 3 days of cough, sore throat secondary to the cough.  No fevers, body aches, headaches, nasal congestion, rhinorrhea, sinus pain or pressure, postnasal drip, loss of sense of smell or taste, wheezing, chest pain, shortness of breath, nausea, vomiting, diarrhea, abdominal pain.  No GERD or allergy symptoms.  No known COVID or flu exposure.  She did not get the COVID-vaccine, but she got this years flu vaccine.  No antipyretic in the past 6 hours.  He is unable to sleep at night secondary to the cough.  She is worried about COVID.  She has tried NyQuil, and ginger/honey/lemon tea with improvement in her symptoms.  Symptoms are worse at night.  She had COVID in fall 2021.  No history of allergies, GERD, pulmonary disease.  LMP: Yesterday.  Denies possibility being pregnant.  PCP: [facility]    Past Medical History:  Diagnosis Date   Allergy    STD (sexually transmitted disease) 01/2014   Tx'd for Chlamydia--did have neg. test of cure   Urinary incontinence     Past Surgical History:  Procedure Laterality Date   BREAST BIOPSY     wisdom teeth ext      Family History  Problem Relation Age of Onset   Hyperlipidemia Brother    Hypertension Brother    Cancer Maternal Grandfather        Lung Ca   Stroke Paternal Grandfather    Cancer Maternal Grandmother 56        Dec Esophogeal Ca   Hypertension Mother     Social History   Tobacco Use   Smoking status: Never   Smokeless tobacco: Never  Substance Use Topics   Alcohol use: Yes    Alcohol/week: 0.0 standard drinks    Comment: only special occasions   Drug use: No    No current facility-administered medications for this encounter.  Current Outpatient Medications:    benzonatate (TESSALON) 200 MG capsule, Take 1 capsule (200 mg total) by mouth 3 (three) times daily as needed for cough., Disp: 30 capsule, Rfl: 0   promethazine-dextromethorphan  (PROMETHAZINE-DM) 6.25-15 MG/5ML syrup, Take 5 mLs by mouth 4 (four) times daily as needed for cough., Disp: 118 mL, Rfl: 0  No Known Allergies   ROS  As noted in HPI.   Physical Exam  BP 108/72 (BP Location: Left Arm)    Pulse 89    Temp 99.2 F (37.3 C) (Oral)    Resp 17    LMP 11/27/2021    SpO2 97%   Constitutional: Well developed, well nourished, no acute distress.  Coughing. Eyes:  EOMI, conjunctiva normal bilaterally HENT: Normocephalic, atraumatic,mucus membranes moist.  No nasal congestion.  No sinus tenderness.  Normal tonsils without exudates, uvula midline.  Irritated oropharynx.  No postnasal drip.  Some cobblestoning. Neck: No cervical lymphadenopathy Respiratory: Normal inspiratory effort, lungs clear bilaterally. Cardiovascular: Normal rate, regular rhythm, no murmurs, rubs, gallop GI: nondistended skin: No rash, skin intact Musculoskeletal: no deformities Neurologic: Alert & oriented x 3, no focal neuro deficits Psychiatric: Speech and behavior appropriate   ED Course   Medications - No data to display  Orders Placed This Encounter  Procedures   SARS CORONAVIRUS 2 (TAT 6-24 HRS) Nasopharyngeal Nasopharyngeal Swab    Standing Status:   Standing    Number of Occurrences:   1   Results for orders placed or performed during the hospital encounter of 12/02/21  SARS CORONAVIRUS 2 (TAT 6-24 HRS) Nasopharyngeal Nasopharyngeal Swab   Specimen: Nasopharyngeal Swab  Result Value Ref Range   SARS Coronavirus 2 NEGATIVE NEGATIVE    No results found for this or any previous visit (from the past 24 hour(s)). No results found.  ED Clinical Impression  1. Acute cough   2. Encounter for laboratory testing for COVID-19 virus      ED Assessment/Plan  Patient with a cough, primary concern is possible COVID.  She qualifies for [facility] because she is unvaccinated.  In the meantime, home with promethazine DM, Tessalon for the cough during the day honey, citrus,  hot water teas, salt water gargles.   Phone number [phone].  She has MyChart.  Follow-up with PMD as needed.  COVID-negative.  Discussed labs, MDM, treatment plan, and plan for follow-up with patient. patient agrees with plan.   Meds ordered this encounter  Medications   promethazine-dextromethorphan (PROMETHAZINE-DM) 6.25-15 MG/5ML syrup    Sig: Take 5 mLs by mouth 4 (four) times daily as needed for cough.    Dispense:  118 mL    Refill:  0   benzonatate (TESSALON) 200 MG capsule    Sig: Take 1 capsule (200 mg total) by mouth 3 (three) times daily as needed for cough.    Dispense:  30 capsule    Refill:  0      *This clinic note was created using L[employer]. Therefore, there may be occasional mistakes despite careful proofreading.  ?    Melynda Ripple, MD 12/03/21 2049

## 2021-12-02 NOTE — Discharge Instructions (Addendum)
promethazine DM for the cough at night or if it is uncontrolled with Tessalon during the day, Tessalon for the cough during the day, honey, citrus, hot water teas as often as you want, salt water gargles for your sore throat.  COVID testing will be back in 6 to 24 hours.  We will prescribe Molnupiravir if it is positive.  If you get a positive result in MyChart, call here, let us know so that we can get you started on Molnupiravir quickly.

## 2021-12-03 LAB — SARS CORONAVIRUS 2 (TAT 6-24 HRS): SARS Coronavirus 2: NEGATIVE

## 2023-05-10 ENCOUNTER — Other Ambulatory Visit: Payer: Self-pay

## 2023-05-10 ENCOUNTER — Inpatient Hospital Stay (HOSPITAL_COMMUNITY)
Admission: AD | Admit: 2023-05-10 | Discharge: 2023-05-10 | Disposition: A | Discharge status: Home / Self Care | Payer: Medicaid Other | Attending: Emergency Medicine | Admitting: Emergency Medicine

## 2023-05-10 ENCOUNTER — Encounter (HOSPITAL_COMMUNITY): Payer: Self-pay

## 2023-05-10 DIAGNOSIS — O200 Threatened abortion: Secondary | ICD-10-CM | POA: Diagnosis present

## 2023-05-10 DIAGNOSIS — Z3A00 Weeks of gestation of pregnancy not specified: Secondary | ICD-10-CM

## 2023-05-10 LAB — URINALYSIS, ROUTINE W REFLEX MICROSCOPIC
Bilirubin Urine: NEGATIVE
Glucose, UA: NEGATIVE mg/dL
Ketones, ur: NEGATIVE mg/dL
Leukocytes,Ua: NEGATIVE
Nitrite: NEGATIVE
Protein, ur: NEGATIVE mg/dL
RBC / HPF: >50 RBC/hpf (ref 0–5)
Specific Gravity, Urine: 1.014 (ref 1.005–1.030)
pH: 5 (ref 5.0–8.0)

## 2023-05-10 LAB — BASIC METABOLIC PANEL
Anion gap: 12 (ref 5–15)
BUN: 9 mg/dL (ref 6–20)
CO2: 24 mmol/L (ref 22–32)
Calcium: 9.6 mg/dL (ref 8.9–10.3)
Chloride: 101 mmol/L (ref 98–111)
Creatinine, Ser: 0.79 mg/dL (ref 0.44–1.00)
GFR, Estimated: >60 mL/min (ref >60)
Glucose, Bld: 107 mg/dL — ABNORMAL HIGH (ref 70–99)
Potassium: 3.9 mmol/L (ref 3.5–5.1)
Sodium: 137 mmol/L (ref 135–145)

## 2023-05-10 LAB — CBC
HCT: 40.6 % (ref 36.0–46.0)
Hemoglobin: 12.5 g/dL (ref 12.0–15.0)
MCH: 24.5 pg — ABNORMAL LOW (ref 26.0–34.0)
MCHC: 30.8 g/dL (ref 30.0–36.0)
MCV: 79.5 fL — ABNORMAL LOW (ref 80.0–100.0)
Platelets: 321 10*3/uL (ref 150–400)
RBC: 5.11 MIL/uL (ref 3.87–5.11)
RDW: 13 % (ref 11.5–15.5)
WBC: 7.4 10*3/uL (ref 4.0–10.5)
nRBC: 0 % (ref 0.0–0.2)

## 2023-05-10 LAB — I-STAT BETA HCG BLOOD, ED (MC, WL, AP ONLY): I-stat hCG, quantitative: 215.2 m[IU]/mL — ABNORMAL HIGH (ref <5)

## 2023-05-10 LAB — HCG, QUANTITATIVE, PREGNANCY: hCG, Beta Chain, Quant, S: 238 m[IU]/mL — ABNORMAL HIGH (ref <5)

## 2023-05-10 NOTE — ED Provider Notes (Signed)
Discussed case with Dr. Adrian Blackwater of OBGYN who has accepted patient to MAU.    Al Decant, PA-C 05/10/23 1644    Horton, Clabe Seal, DO 05/10/23 2118

## 2023-05-10 NOTE — MAU Provider Note (Signed)
None     S [name] is a 32 y.o. G2P1011 patient who presents to MAU today with complaint of vaginal bleeding that started a few days ago. This is about the time of her period. She was being evaluated by PCP for cyst that ruptured at the end of May. She went to the ED due to the bleeding. She doesn't like the feeling of the pad on her skin, so she changes her pad frequently and the pad is much less than a normal period.   O BP 115/82 (BP Location: Right Arm)   Pulse 76   Temp 98 F (36.7 C) (Oral)   Resp 19   Ht 5\' 5"  (1.651 m)   Wt 84.3 kg   LMP 04/05/2023   SpO2 99%   BMI 30.94 kg/m  Physical Exam Constitutional:      Appearance: Normal appearance.  Cardiovascular:     Rate and Rhythm: Normal rate.  Abdominal:     General: Abdomen is flat.     Palpations: Abdomen is soft.     Tenderness: There is no abdominal tenderness. There is no guarding or rebound.     Hernia: No hernia is present.  Skin:    Capillary Refill: Capillary refill takes less than 2 seconds.  Neurological:     General: No focal deficit present.     Mental Status: She is alert.  Psychiatric:        Mood and Affect: Mood normal.        Behavior: Behavior normal.        Thought Content: Thought content normal.        Judgment: Judgment normal.    Results for orders placed or performed during the hospital encounter of 05/10/23 (from the past 24 hour(s))  Urinalysis, Routine w reflex microscopic -Urine, Clean Catch     Status: Abnormal   Collection Time: 05/10/23  1:07 PM  Result Value Ref Range   Color, Urine YELLOW YELLOW   APPearance CLEAR CLEAR   Specific Gravity, Urine 1.014 1.005 - 1.030   pH 5.0 5.0 - 8.0   Glucose, UA NEGATIVE NEGATIVE mg/dL   Hgb urine dipstick LARGE (A) NEGATIVE   Bilirubin Urine NEGATIVE NEGATIVE   Ketones, ur NEGATIVE NEGATIVE mg/dL   Protein, ur NEGATIVE NEGATIVE mg/dL   Nitrite NEGATIVE NEGATIVE   Leukocytes,Ua NEGATIVE NEGATIVE   RBC / HPF >50 0 - 5 RBC/hpf    WBC, UA 0-5 0 - 5 WBC/hpf   Bacteria, UA RARE (A) NONE SEEN   Squamous Epithelial / HPF 0-5 0 - 5 /HPF   Mucus PRESENT   CBC     Status: Abnormal   Collection Time: 05/10/23  1:50 PM  Result Value Ref Range   WBC 7.4 4.0 - 10.5 K/uL   RBC 5.11 3.87 - 5.11 MIL/uL   Hemoglobin 12.5 12.0 - 15.0 g/dL   HCT 47.8 29.5 - 62.1 %   MCV 79.5 (L) 80.0 - 100.0 fL   MCH 24.5 (L) 26.0 - 34.0 pg   MCHC 30.8 30.0 - 36.0 g/dL   RDW 30.8 65.7 - 84.6 %   Platelets 321 150 - 400 K/uL   nRBC 0.0 0.0 - 0.2 %  Basic metabolic panel     Status: Abnormal   Collection Time: 05/10/23  1:50 PM  Result Value Ref Range   Sodium 137 135 - 145 mmol/L   Potassium 3.9 3.5 - 5.1 mmol/L   Chloride 101 98 - 111 mmol/L  CO2 24 22 - 32 mmol/L   Glucose, Bld 107 (H) 70 - 99 mg/dL   BUN 9 6 - 20 mg/dL   Creatinine, Ser 8.11 0.44 - 1.00 mg/dL   Calcium 9.6 8.9 - 91.4 mg/dL   GFR, Estimated >78 >29 mL/min   Anion gap 12 5 - 15  ABO/Rh     Status: None   Collection Time: 05/10/23  1:50 PM  Result Value Ref Range   ABO/RH(D)      B POS Performed at [facility], [address] [place], [place] [address]   I-Stat beta hCG blood, ED ([facility], [facility], [facility] only)     Status: Abnormal   Collection Time: 05/10/23  2:05 PM  Result Value Ref Range   I-stat hCG, quantitative 215.2 (H) <5 mIU/mL   Comment 3          hCG, quantitative, pregnancy     Status: Abnormal   Collection Time: 05/10/23  2:10 PM  Result Value Ref Range   hCG, Beta Chain, Quant, S 238 (H) <5 mIU/mL     A 1. Threatened miscarriage     P Discharge from MAU in stable condition Will follow up with HCG in office in 48 hours. Warning signs for worsening condition that would warrant emergency follow-up discussed Patient may return to MAU as needed   Levie Heritage, DO 05/10/2023 7:24 PM

## 2023-05-10 NOTE — MAU Note (Signed)
Misty Kelly is a 32 y.o. at Unknown here in MAU reporting: she has VB and bloating.  States she a had a ovarian cyst that ruptured, had ultrasound 04/28/2023.  States had spotting after cyst ruptured but VB began getting heavier on June 2 or June 3 Reports she had continued to bleed and VB is heavier.  Reports changing sanitary napkin every hour but pad isn't completely saturated. LMP: 04/05/2023 Onset of complaint: 1 week ago Pain score: 0 Vitals:   05/10/23 1310 05/10/23 1740  BP: 137/88 115/82  Pulse: 84 76  Resp: 16 19  Temp: 98.3 F (36.8 C) 98 F (36.7 C)  SpO2: 99% 99%     FHT:NA Lab orders placed from triage:   None

## 2023-05-10 NOTE — ED Triage Notes (Signed)
Pt c/o sharp pelvic pain with some spotting since 5/19 attributed to cyst rupture, by OBGYN. Pt states blood has went from dark red to bright red. Pt states she has had heavy bleeding, starting 6/2. On 6/6 and 6/7 pt was bloated and uncomfortable, with pressure. Pt states she has continued to have heavy menstrual bleeding.  Pelvic US performed 5/29 at ob, which showed Prominent fluid within the endometrial cavity   LMP 5/6-5/9  Pt frequently changing pad .

## 2023-05-10 NOTE — ED Provider Triage Note (Signed)
Emergency Medicine Provider Triage Evaluation Note  Misty Kelly , a 32 y.o. female  was evaluated in triage.  Pt complains of vaginal bleeding.  Patient reports that she began having pelvic pain back on May 19 and was seen by her OB/GYN 5 days later.  Patient states that OB/GYN did blood work as well as ultrasound imaging which was unremarkable.  The patient's OB/GYN attributed her pelvic pain and vaginal bleeding to a ruptured ovarian cyst.  She had ultrasound imaging done which showed a large amount of fluid in the endometrial cavity however no other acute abnormalities were noted.  Patient comes in today complaining of increased vaginal bleeding.  She states that she is going through "maybe" 1 pad per hour.  She denies lightheadedness, dizziness, weakness, shortness of breath.  She denies any pain in her pelvis currently.  Denies any dysuria, fevers, nausea or vomiting.  She is concerned because there appears to be "new blood" coming out of her vagina when in the past there was only "old blood".  The patient states that what she means by this is fresh red blood versus dark red blood.  Review of Systems  Positive:  Negative:   Physical Exam  BP 137/88   Pulse 84   Temp 98.3 F (36.8 C)   Resp 16   Ht 5\' 5"  (1.651 m)   Wt 83.9 kg   LMP 04/05/2023   SpO2 99%   BMI 30.79 kg/m  Gen:   Awake, no distress   Resp:  Normal effort  MSK:   Moves extremities without difficulty  Other:  No abdominal tenderness  Medical Decision Making  Medically screening exam initiated at 1:38 PM.  Appropriate orders placed.  Misty Kelly was informed that the remainder of the evaluation will be completed by another provider, this initial triage assessment does not replace that evaluation, and the importance of remaining in the ED until their evaluation is complete.     Al Decant, PA-C 05/10/23 1339

## 2023-05-11 LAB — ABO/RH: ABO/RH(D): B POS

## 2023-05-12 ENCOUNTER — Ambulatory Visit: Payer: Medicaid Other

## 2023-05-12 DIAGNOSIS — O200 Threatened abortion: Secondary | ICD-10-CM

## 2023-05-12 NOTE — Progress Notes (Signed)
Patient sent to lab for HCG blood draw. Armandina Stammer RN

## 2023-05-13 LAB — BETA HCG QUANT (REF LAB): hCG Quant: 148 m[IU]/mL

## 2023-05-17 ENCOUNTER — Ambulatory Visit: Payer: Medicaid Other

## 2023-05-18 ENCOUNTER — Encounter: Payer: Self-pay | Admitting: Family Medicine

## 2023-05-18 DIAGNOSIS — R102 Pelvic and perineal pain: Secondary | ICD-10-CM

## 2023-05-20 ENCOUNTER — Ambulatory Visit: Payer: Medicaid Other

## 2023-05-20 DIAGNOSIS — O200 Threatened abortion: Secondary | ICD-10-CM

## 2023-05-20 NOTE — Progress Notes (Signed)
Patient sent to lab. Svetlana Bagby HowardRN   

## 2023-05-21 LAB — BETA HCG QUANT (REF LAB): hCG Quant: 41 m[IU]/mL

## 2023-06-17 ENCOUNTER — Ambulatory Visit: Payer: Medicaid Other | Admitting: Family Medicine

## 2023-06-17 ENCOUNTER — Encounter: Payer: Self-pay | Admitting: Family Medicine

## 2023-06-17 VITALS — BP 117/78 | HR 90 | Wt 184.0 lb

## 2023-06-17 DIAGNOSIS — O039 Complete or unspecified spontaneous abortion without complication: Secondary | ICD-10-CM

## 2023-06-17 NOTE — Progress Notes (Signed)
   Subjective:    Patient ID: Misty Kelly, female    DOB: 04/29/1991, 32 y.o.   MRN: [id number]  HPI Patient seen for follow-up of SAB.  Her bleeding has resolved at the end of June.  Her repeat hormone levels were significantly decreased.  She is not having any further cramping.  Her periods have not returned yet.  She is thinking about continuing to try to get pregnant.   Review of Systems     Objective:   Physical Exam Vitals and nursing note reviewed.  Constitutional:      Appearance: Normal appearance.  Cardiovascular:     Rate and Rhythm: Normal rate and regular rhythm.  Abdominal:     General: Abdomen is flat.     Palpations: Abdomen is soft.     Tenderness: There is abdominal tenderness (mild LLQ). There is no guarding or rebound.  Skin:    Capillary Refill: Capillary refill takes less than 2 seconds.  Neurological:     General: No focal deficit present.     Mental Status: She is alert.  Psychiatric:        Mood and Affect: Mood normal.        Behavior: Behavior normal.        Thought Content: Thought content normal.        Judgment: Judgment normal.       Assessment & Plan:  1. SAB (spontaneous abortion) Reaffirmed to patient that there is nothing that she did to cause this to happen.  Continue multivitamin/prenatal vitamin.  She does have mild tenderness on the left side: Patient to let me know if persistent or increasing pain.  Will check ultrasound if that happens.

## 2023-07-09 ENCOUNTER — Telehealth (HOSPITAL_BASED_OUTPATIENT_CLINIC_OR_DEPARTMENT_OTHER): Payer: Self-pay

## 2023-07-13 ENCOUNTER — Ambulatory Visit: Payer: Medicaid Other

## 2023-07-13 DIAGNOSIS — N912 Amenorrhea, unspecified: Secondary | ICD-10-CM

## 2023-07-13 NOTE — Progress Notes (Signed)
Patient sent to lab for [facility] since she recently had positivie pregnancy test at home. Patient states she had miscarriage in May. Armandina Stammer RN

## 2023-07-23 ENCOUNTER — Telehealth (HOSPITAL_BASED_OUTPATIENT_CLINIC_OR_DEPARTMENT_OTHER): Payer: Self-pay

## 2023-08-01 ENCOUNTER — Telehealth (HOSPITAL_COMMUNITY): Payer: Medicaid Other

## 2023-08-01 ENCOUNTER — Encounter (HOSPITAL_COMMUNITY): Payer: Self-pay

## 2023-08-01 ENCOUNTER — Inpatient Hospital Stay (HOSPITAL_COMMUNITY)
Admission: AD | Admit: 2023-08-01 | Discharge: 2023-08-01 | Disposition: A | Discharge status: Home / Self Care | Payer: Medicaid Other | Attending: Obstetrics and Gynecology | Admitting: Obstetrics and Gynecology

## 2023-08-01 ENCOUNTER — Inpatient Hospital Stay (HOSPITAL_COMMUNITY): Payer: Medicaid Other

## 2023-08-01 DIAGNOSIS — Z3A01 Less than 8 weeks gestation of pregnancy: Secondary | ICD-10-CM | POA: Diagnosis not present

## 2023-08-01 DIAGNOSIS — O3680X0 Pregnancy with inconclusive fetal viability, not applicable or unspecified: Secondary | ICD-10-CM | POA: Diagnosis not present

## 2023-08-01 DIAGNOSIS — Z3A00 Weeks of gestation of pregnancy not specified: Secondary | ICD-10-CM

## 2023-08-01 DIAGNOSIS — O209 Hemorrhage in early pregnancy, unspecified: Secondary | ICD-10-CM | POA: Diagnosis present

## 2023-08-01 DIAGNOSIS — O2691 Pregnancy related conditions, unspecified, first trimester: Secondary | ICD-10-CM

## 2023-08-01 LAB — WET PREP, GENITAL
Clue Cells Wet Prep HPF POC: NONE SEEN
Sperm: NONE SEEN
Trich, Wet Prep: NONE SEEN
WBC, Wet Prep HPF POC: >=10 — AB (ref <10)
Yeast Wet Prep HPF POC: NONE SEEN

## 2023-08-01 LAB — CBC
HCT: 38.9 % (ref 36.0–46.0)
Hemoglobin: 12.6 g/dL (ref 12.0–15.0)
MCH: 25.5 pg — ABNORMAL LOW (ref 26.0–34.0)
MCHC: 32.4 g/dL (ref 30.0–36.0)
MCV: 78.6 fL — ABNORMAL LOW (ref 80.0–100.0)
Platelets: 315 10*3/uL (ref 150–400)
RBC: 4.95 MIL/uL (ref 3.87–5.11)
RDW: 13.1 % (ref 11.5–15.5)
WBC: 15.3 10*3/uL — ABNORMAL HIGH (ref 4.0–10.5)
nRBC: 0 % (ref 0.0–0.2)

## 2023-08-01 LAB — URINALYSIS, ROUTINE W REFLEX MICROSCOPIC
Bilirubin Urine: NEGATIVE
Glucose, UA: NEGATIVE mg/dL
Hgb urine dipstick: NEGATIVE
Ketones, ur: NEGATIVE mg/dL
Leukocytes,Ua: NEGATIVE
Nitrite: NEGATIVE
Protein, ur: NEGATIVE mg/dL
Specific Gravity, Urine: 1.011 (ref 1.005–1.030)
pH: 7 (ref 5.0–8.0)

## 2023-08-01 LAB — HCG, QUANTITATIVE, PREGNANCY: hCG, Beta Chain, Quant, S: 35363 m[IU]/mL — ABNORMAL HIGH (ref <5)

## 2023-08-01 NOTE — MAU Note (Signed)
..  Misty Kelly is a 32 y.o. at Unknown here in MAU reporting: lower abdominal pain that is sharp and comes and goes. Is also having sharp pain that is in her vagina. Reports spotting, pink-mucous and only when she wipes.  Report she is also having dizziness and fatigue.  LMP: had a miscarriage in May and never had a period after that.  Onset of complaint: Friday Last intercourse: 07/22/2023 Pain score: 8/10 Vitals:   08/01/23 2005  BP: 123/87  Pulse: 100  Resp: 17  Temp: 99 F (37.2 C)  SpO2: 100%     FHT:n/a Lab orders placed from triage: UA

## 2023-08-01 NOTE — MAU Provider Note (Signed)
Chief Complaint: Abdominal Pain and Vaginal Bleeding    SUBJECTIVE HPI: Misty Kelly is a 32 y.o. G3P1011 at Unknown by LMP who presents to maternity admissions reporting sharp intermittent lower abdominal pain with radiation to her vagina. Associated lightheadedness, fatigue, and light pink mucoid spotting on toilet paper with wiping.   Hx of SAB in May. No menses since.   She denies vaginal bleeding, vaginal itching/burning, urinary symptoms, h/a, n/v, or fever/chills.    HPI  Past Medical History:  Diagnosis Date   Allergy    STD (sexually transmitted disease) 01/2014   Tx'd for Chlamydia--did have neg. test of cure   Urinary incontinence    Past Surgical History:  Procedure Laterality Date   BREAST BIOPSY     wisdom teeth ext     Social History   Socioeconomic History   Marital status: Significant Other    Spouse name: Not on file   Number of children: Not on file   Years of education: Not on file   Highest education level: Not on file  Occupational History   Not on file  Tobacco Use   Smoking status: Never   Smokeless tobacco: Never  Substance and Sexual Activity   Alcohol use: Yes    Alcohol/week: 0.0 standard drinks of alcohol    Comment: only special occasions   Drug use: No   Sexual activity: Yes    Partners: Male    Birth control/protection: None  Other Topics Concern   Not on file  Social History Narrative   Not on file   Social Determinants of Health   Financial Resource Strain: Low Risk  (01/05/2023)   Received from [facility], [facility]   Overall Financial Resource Strain (CARDIA)    Difficulty of Paying Living Expenses: Not hard at all  Food Insecurity: No Food Insecurity (01/05/2023)   Received from [facility], [facility]   Hunger Vital Sign    Worried About Running Out of Food in the Last Year: Never true    Ran Out of Food in the Last Year: Never true  Transportation Needs: No Transportation Needs (01/05/2023)   Received from [facility], [facility]   PRAPARE - Transportation    Lack of Transportation (Medical): No    Lack of Transportation (Non-Medical): No  Physical Activity: Insufficiently Active (01/05/2023)   Received from [facility], [facility]   Exercise Vital Sign    Days of Exercise per Week: 2 days    Minutes of Exercise per Session: 10 min  Stress: No Stress Concern Present (01/05/2023)   Received from [facility] - Occupational Stress Questionnaire    Feeling of Stress : Not at all  Social Connections: Socially Integrated (01/05/2023)   Received from [facility], [facility]   Social Network    How would you rate your social network (family, work, friends)?: Good participation with social networks  Intimate Partner Violence: Not At Risk (01/05/2023)   Received from [facility], [facility]   HITS    Over the last 12 months how often did your partner physically hurt you?: 1    Over the last 12 months how often did your partner insult you or talk down to you?: 1    Over the last 12 months how often did your partner threaten you with physical harm?: 1    Over the last 12 months how often did your partner scream or curse at you?: 1  No current facility-administered medications on file prior to encounter.   Current Outpatient Medications on File Prior to Encounter  Medication Sig Dispense Refill   Prenatal Vit-Fe Fumarate-FA (MULTIVITAMIN-PRENATAL) 27-0.8 MG TABS tablet Take 1 tablet by mouth daily at 12 noon.     benzonatate (TESSALON) 200 MG capsule Take 1 capsule (200 mg total) by mouth 3 (three) times daily as needed for cough. (Patient not taking: Reported on 06/17/2023) 30 capsule 0   promethazine-dextromethorphan (PROMETHAZINE-DM) 6.25-15 MG/5ML syrup Take 5 mLs by mouth 4 (four) times daily as needed for cough. (Patient not taking: Reported on 06/17/2023) 118 mL 0   [DISCONTINUED] Ferrous Fumarate (HEMOCYTE - 106 MG FE) 324 (106  Fe) MG TABS tablet Take 1 tablet (106 mg of iron total) by mouth 2 (two) times daily. (Patient not taking: Reported on 10/07/2018) 60 tablet 3   No Known Allergies  I have reviewed patient's Past Medical Hx, Surgical Hx, Family Hx, Social Hx, medications and allergies.   ROS:  Review of Systems Review of Systems  Other systems negative   Physical Exam  Physical Exam Patient Vitals for the past 24 hrs:  BP Temp Temp src Pulse Resp SpO2 Height Weight  08/01/23 2005 123/87 99 F (37.2 C) Oral 100 17 100 % 5\' 5"  (1.651 m) 84.1 kg   Constitutional: Well-developed, well-nourished female in no acute distress.  Cardiovascular: normal rate Respiratory: normal effort GI: Abd soft, non-tender. Pos BS x 4 MS: Extremities nontender, no edema, normal ROM Neurologic: Alert and oriented x 4.  GU: Neg CVAT.  LAB RESULTS Results for orders placed or performed during the hospital encounter of 08/01/23 (from the past 24 hour(s))  CBC     Status: Abnormal   Collection Time: 08/01/23  8:13 PM  Result Value Ref Range   WBC 15.3 (H) 4.0 - 10.5 K/uL   RBC 4.95 3.87 - 5.11 MIL/uL   Hemoglobin 12.6 12.0 - 15.0 g/dL   HCT 56.2 13.0 - 86.5 %   MCV 78.6 (L) 80.0 - 100.0 fL   MCH 25.5 (L) 26.0 - 34.0 pg   MCHC 32.4 30.0 - 36.0 g/dL   RDW 78.4 69.6 - 29.5 %   Platelets 315 150 - 400 K/uL   nRBC 0.0 0.0 - 0.2 %  hCG, quantitative, pregnancy     Status: Abnormal   Collection Time: 08/01/23  8:13 PM  Result Value Ref Range   hCG, Beta Chain, Quant, S 35,363 (H) <5 mIU/mL  Wet prep, genital     Status: Abnormal   Collection Time: 08/01/23  8:21 PM  Result Value Ref Range   Yeast Wet Prep HPF POC NONE SEEN NONE SEEN   Trich, Wet Prep NONE SEEN NONE SEEN   Clue Cells Wet Prep HPF POC NONE SEEN NONE SEEN   WBC, Wet Prep HPF POC >=10 (A) <10   Sperm NONE SEEN   Urinalysis, Routine w reflex microscopic -Urine, Clean Catch     Status: Abnormal   Collection Time: 08/01/23  8:21 PM  Result Value Ref Range    Color, Urine STRAW (A) YELLOW   APPearance CLEAR CLEAR   Specific Gravity, Urine 1.011 1.005 - 1.030   pH 7.0 5.0 - 8.0   Glucose, UA NEGATIVE NEGATIVE mg/dL   Hgb urine dipstick NEGATIVE NEGATIVE   Bilirubin Urine NEGATIVE NEGATIVE   Ketones, ur NEGATIVE NEGATIVE mg/dL   Protein, ur NEGATIVE NEGATIVE mg/dL   Nitrite NEGATIVE NEGATIVE   Leukocytes,Ua NEGATIVE NEGATIVE    --/--/  B POS (06/10 1350)  IMAGING US OB LESS THAN 14 WEEKS WITH OB TRANSVAGINAL  Result Date: 08/01/2023 CLINICAL DATA:  Pain in vaginal bleeding affecting early pregnancy. LMP 06/03/2023. Beta HCG unknown. EXAM: OBSTETRIC <14 WK [place] AND TRANSVAGINAL OB US TECHNIQUE: Both transabdominal and transvaginal ultrasound examinations were performed for complete evaluation of the gestation as well as the maternal uterus, adnexal regions, and pelvic cul-de-sac. Transvaginal technique was performed to assess early pregnancy. COMPARISON:  None Available. FINDINGS: Intrauterine gestational sac: None Yolk sac:  Not Visualized. Embryo:  Not Visualized. Cardiac Activity: Not Visualized. Heart Rate: Not applicable MSD: Not applicable Subchorionic hemorrhage:  Not applicable Maternal uterus/adnexae: Thickened endometrium measuring approximately 24 mm with cystic areas. Normal ovaries. No free fluid. IMPRESSION: Thickened endometrium without visualized intrauterine gestational sac. In the setting of positive pregnancy test, this reflects a pregnancy of unknown location. Differential considerations include early normal IUP, abnormal IUP, or nonvisualized ectopic pregnancy. Differentiation is achieved with serial beta HCG supplemented by repeat sonography as clinically warranted. Electronically Signed   By: Minerva Fester M.D.   On: 08/01/2023 21:11    MAU Management/MDM: I have reviewed the triage vital signs and the nursing notes.   Pertinent labs & imaging results that were available during my care of the patient were reviewed by me and  considered in my medical decision making (see chart for details).      I have reviewed her medical records including past results, notes and treatments. Medical, Surgical, and family history were reviewed.  Medications and recent lab tests were reviewed  Ordered usual first trimester r/o ectopic labs.   Pelvic exam and cultures done Will check baseline Ultrasound to rule out ectopic.  Discussed presentation, exam findings, and results with attending Dr. Roma Kayser. Treatments in MAU included CBC, UA, vaginal swabs, [place].   This bleeding/pain can represent a normal pregnancy with bleeding, spontaneous abortion or even an ectopic which can be life-threatening.  The process as listed above helps to determine which of these is present.   ASSESSMENT 1. Pregnancy of unknown anatomic location   2. Abnormal pregnancy in first trimester   Actively trying to conceive with positive home pregnancy test.   Grossly abnormal [place] with elevated bHCG >35,000 with suspected gestation <12 weeks highly suspicious for abnormal pregnancy, suspicious of partial molar pregnancy given no visible gestational sac or fetal pole.   Dicussed abnormal pregnancy and indication for MVA vs D&C with pathology to r/o molar pregnancy. Patient and spouse prefer D&C.   PLAN Discharge home Plan for [facility], message sent to OR scheduling.  Ectopic precautions  Follow-up Information     [facility] Follow up.   Why: You will be called Tuesday to scheduled a D&C.  Return to MAU for worsening abdominal pain or vaginal bleeding of a pad an hour or more, or fever. Contact information: [address] [place] [address]               Pt stable at time of discharge. Encouraged to return here if she develops worsening of symptoms, increase in pain, fever, or other concerning symptoms.   Wyn Forster, MD FMOB Fellow, Faculty practice [facility], Center for [facility]  Healthcare  08/01/2023  11:05 PM

## 2023-08-03 ENCOUNTER — Other Ambulatory Visit: Payer: Self-pay | Admitting: Obstetrics and Gynecology

## 2023-08-03 ENCOUNTER — Telehealth: Payer: Self-pay

## 2023-08-03 DIAGNOSIS — Z01812 Encounter for preprocedural laboratory examination: Secondary | ICD-10-CM

## 2023-08-03 NOTE — Telephone Encounter (Signed)
Patient was scheduled for an emergency procedure on 08/04/23. Patient says her husband was unavailable to bring her and she needed procedure moved. Spoke to OR and they are moving the case to 08/09/23 @[facility]  Main/10 am. Patient states she will arrive shortly after 8 am bc she has to take her daughter to school first. Pre-Op instructions provided by phone.

## 2023-08-04 ENCOUNTER — Encounter: Payer: Self-pay | Admitting: Family Medicine

## 2023-08-04 ENCOUNTER — Ambulatory Visit (INDEPENDENT_AMBULATORY_CARE_PROVIDER_SITE_OTHER): Payer: Medicaid Other | Admitting: Family Medicine

## 2023-08-04 ENCOUNTER — Other Ambulatory Visit (HOSPITAL_COMMUNITY): Payer: Self-pay | Admitting: Obstetrics and Gynecology

## 2023-08-04 VITALS — BP 123/76 | HR 90 | Wt 179.0 lb

## 2023-08-04 DIAGNOSIS — O2691 Pregnancy related conditions, unspecified, first trimester: Secondary | ICD-10-CM

## 2023-08-04 DIAGNOSIS — Z3A01 Less than 8 weeks gestation of pregnancy: Secondary | ICD-10-CM | POA: Diagnosis not present

## 2023-08-04 DIAGNOSIS — O020 Blighted ovum and nonhydatidiform mole: Secondary | ICD-10-CM

## 2023-08-04 NOTE — Progress Notes (Signed)
   Subjective:    Patient ID: Misty Kelly, female    DOB: 05-Jan-1991, 32 y.o.   MRN: [id number]  HPI Patient was seen in MAU over the weekend due to spotting due to pregnancy.  She had hCG drawn and [place].  The hCG was 35,000.  The ultrasound showed endometrial complex measuring about 24 mm with multiple cystic areas.  She did have some confusion regarding what the ultrasound showed.  She did get scheduled for a D&E because she was told that it was "not a normal pregnancy". She does have a little bit of spotting.  She has some mild discomfort in the left lower pelvis.   Review of Systems     Objective:   Physical Exam Vitals reviewed.  Constitutional:      Appearance: Normal appearance.  Abdominal:     General: Abdomen is flat.     Palpations: Abdomen is soft.  Skin:    General: Skin is warm and dry.     Capillary Refill: Capillary refill takes less than 2 seconds.  Neurological:     General: No focal deficit present.     Mental Status: She is alert.  Psychiatric:        Mood and Affect: Mood normal.        Behavior: Behavior normal.        Thought Content: Thought content normal.        Judgment: Judgment normal.        Assessment & Plan:  1. Abnormal pregnancy in first trimester Limited ultrasound done by me.  I reexamined and showed her the endometrium which again measured approximately 2.7 cm.  I discussed the lab values and the appearance of the pregnancy tissue are consistent with a molar pregnancy.  I discussed the pathophysiology and implications of a molar pregnancy.  We discussed that this will not lead to a viable pregnancy and that it is necessary for her to undergo a D&E, along with following her hCGs to normal.  The pregnancy tissue will be sent to pathology for confirmation of the molar pregnancy.  30 minutes spent with patient in reviewing [place], labs, discussing surgery, etc.

## 2023-08-05 LAB — GC/CHLAMYDIA PROBE AMP (~~LOC~~) NOT AT ARMC
Chlamydia: NEGATIVE
Comment: NEGATIVE
Comment: NORMAL
Neisseria Gonorrhea: NEGATIVE

## 2023-08-06 ENCOUNTER — Ambulatory Visit (HOSPITAL_COMMUNITY): Payer: Self-pay | Admitting: Anesthesiology

## 2023-08-06 ENCOUNTER — Encounter (HOSPITAL_COMMUNITY): Payer: Self-pay | Admitting: Obstetrics and Gynecology

## 2023-08-06 ENCOUNTER — Other Ambulatory Visit: Payer: Self-pay

## 2023-08-06 ENCOUNTER — Ambulatory Visit (HOSPITAL_COMMUNITY): Payer: Medicaid Other | Admitting: Anesthesiology

## 2023-08-06 MED ORDER — DOXYCYCLINE HYCLATE 100 MG IV SOLR
200.0000 mg | INTRAVENOUS | Status: DC
  Filled 2023-08-06 (×2): qty 200

## 2023-08-06 NOTE — Progress Notes (Signed)
PCP - Ladora Daniel  Cardiologist - denies  PPM/ICD - denies Device Orders - n/a Rep Notified -n/a   Chest x-ray - denies EKG - n/a Stress Test - n/a ECHO - n/a Cardiac Cath - n/a  CPAP - denies  Dm Denies  Blood Thinner Instructions: denies Aspirin Instructions: n/a  ERAS Protcol - NPO  COVID TEST- n/a  Anesthesia review: no  Patient verbally denies any shortness of breath, fever, cough and chest pain during phone call   -------------  SDW INSTRUCTIONS given:  Your procedure is scheduled on Sept 9,2024 .  Report to [facility] Main Entrance "A" at 7:35 A.M., and check in at the Admitting office.  Call this number if you have problems the morning of surgery:  [phone]   Remember:  Do not eat after or drink midnight the night before your surgery     Take these medicines the morning of surgery with A SIP OF WATER   As of today, STOP taking any Aspirin (unless otherwise instructed by your surgeon) Aleve, Naproxen, Ibuprofen, Motrin, Advil, Goody's, BC's, all herbal medications, fish oil, and all vitamins.                      Do not wear jewelry, make up, or nail polish            Do not wear lotions, powders, perfumes/colognes, or deodorant.            Do not shave 48 hours prior to surgery.  Men may shave face and neck.            Do not bring valuables to the hospital.            [facility] is not responsible for any belongings or valuables.  Do NOT Smoke (Tobacco/Vaping) 24 hours prior to your procedure If you use a CPAP at night, you may bring all equipment for your overnight stay.   Contacts, glasses, dentures or bridgework may not be worn into surgery.      For patients admitted to the hospital, discharge time will be determined by your treatment team.   Patients discharged the day of surgery will not be allowed to drive home, and someone needs to stay with them for 24 hours.    Special instructions:   [place]- Preparing For Surgery  Before  surgery, you can play an important role. Because skin is not sterile, your skin needs to be as free of germs as possible. You can reduce the number of germs on your skin by washing with CHG (chlorahexidine gluconate) Soap before surgery.  CHG is an antiseptic cleaner which kills germs and bonds with the skin to continue killing germs even after washing.    Oral Hygiene is also important to reduce your risk of infection.  Remember - BRUSH YOUR TEETH THE MORNING OF SURGERY WITH YOUR REGULAR TOOTHPASTE  Please do not use if you have an allergy to CHG or antibacterial soaps. If your skin becomes reddened/irritated stop using the CHG.  Do not shave (including legs and underarms) for at least 48 hours prior to first CHG shower. It is OK to shave your face.  Please follow these instructions carefully.   Shower the NIGHT BEFORE SURGERY and the MORNING OF SURGERY with DIAL Soap.   Pat yourself dry with a CLEAN TOWEL.  Wear CLEAN PAJAMAS to bed the night before surgery  Place CLEAN SHEETS on your bed the night of your first shower  and DO NOT SLEEP WITH PETS.   Day of Surgery: Please shower morning of surgery  Wear Clean/Comfortable clothing the morning of surgery Do not apply any deodorants/lotions.   Remember to brush your teeth WITH YOUR REGULAR TOOTHPASTE.   Questions were answered. Patient verbalized understanding of instructions.

## 2023-08-07 ENCOUNTER — Inpatient Hospital Stay (HOSPITAL_COMMUNITY): Payer: Medicaid Other

## 2023-08-07 ENCOUNTER — Inpatient Hospital Stay (HOSPITAL_COMMUNITY)
Admission: AD | Admit: 2023-08-07 | Discharge: 2023-08-07 | Disposition: A | Discharge status: Home / Self Care | Payer: Medicaid Other | Attending: Obstetrics & Gynecology | Admitting: Obstetrics & Gynecology

## 2023-08-07 ENCOUNTER — Encounter (HOSPITAL_COMMUNITY): Payer: Self-pay | Admitting: Obstetrics & Gynecology

## 2023-08-07 ENCOUNTER — Other Ambulatory Visit: Payer: Self-pay

## 2023-08-07 DIAGNOSIS — O3680X0 Pregnancy with inconclusive fetal viability, not applicable or unspecified: Secondary | ICD-10-CM | POA: Insufficient documentation

## 2023-08-07 DIAGNOSIS — O2690 Pregnancy related conditions, unspecified, unspecified trimester: Secondary | ICD-10-CM

## 2023-08-07 DIAGNOSIS — N939 Abnormal uterine and vaginal bleeding, unspecified: Secondary | ICD-10-CM | POA: Diagnosis present

## 2023-08-07 DIAGNOSIS — Z3A01 Less than 8 weeks gestation of pregnancy: Secondary | ICD-10-CM | POA: Insufficient documentation

## 2023-08-07 LAB — URINALYSIS, ROUTINE W REFLEX MICROSCOPIC
Bacteria, UA: NONE SEEN
Bilirubin Urine: NEGATIVE
Glucose, UA: NEGATIVE mg/dL
Ketones, ur: NEGATIVE mg/dL
Leukocytes,Ua: NEGATIVE
Nitrite: NEGATIVE
Protein, ur: 30 mg/dL — AB
RBC / HPF: >50 RBC/hpf (ref 0–5)
Specific Gravity, Urine: 1.016 (ref 1.005–1.030)
pH: 5 (ref 5.0–8.0)

## 2023-08-07 NOTE — MAU Note (Signed)
.  Misty Kelly is a 32 y.o. at Unknown here in MAU reporting: heavy bleeding today around 1700 - gushes of blood and around 1815 passed something that looked like tissue. Pt states she understands that this is a molar pregnancy and was informed on what that meant - plan for D&E on 9/9. Reports cramping earlier today and pelvic pressure - last felt in the car on the ride here, denies current pain.   Onset of complaint: 1700 Pain score: 0 Vitals:   08/07/23 1940  BP: 123/80  Pulse: 78  Resp: 18  Temp: 98 F (36.7 C)  SpO2: 96%     FHT:NA  Lab orders placed from triage:  UA

## 2023-08-07 NOTE — Discharge Instructions (Signed)
Please come back if experiencing severe bleeding or other concerning symptoms  Come for scheduled surgery on 08/09/23

## 2023-08-07 NOTE — MAU Provider Note (Signed)
Faculty Practice OB/GYN Attending MAU Note  Chief Complaint: Vaginal Bleeding    None     SUBJECTIVE Misty Kelly is a 32 y.o. G3P1011  with known abnormal intrauterine pregnancy/suspected molar pregnancy who presents with report of bleeding and passage of tissue at home.  On 08/01/2023, she was evaluated for VB and found to have BHCG > 35k and ultrasound with thickened & cystic appearance of endometrium.Molar pregnancy or abnormally developing IUP was suspected, she was scheduled for D&E.  Patient missed this procedure on 08/04/23, was rescheduled on 08/09/23.  Today, she had heavy bleeding and cramping around 1700 and around 1815 passed something that looked like tissue. No further heavy bleeding since.  Small amount of bleeding since she got here in MAU.  No pelvic pain.  Denies any fevers, chills, sweats, dysuria, nausea, vomiting, other GI or GU symptoms or other general symptoms.  Accompanied by her husband.   Past Medical History:  Diagnosis Date   Allergy    STD (sexually transmitted disease) 01/2014   Tx'd for Chlamydia--did have neg. test of cure   Urinary incontinence    OB History  Gravida Para Term Preterm AB Living  3 1 1  0 1 1  SAB IAB Ectopic Multiple Live Births  1 0 0 0 1    # Outcome Date GA Lbr Len/2nd Weight Sex Type Anes PTL Lv  3 Gravida           2 Term 07/14/18 [redacted]w[redacted]d 09:39 / 01:47 8 lb 4 oz (3.742 kg) F Vag-Spont EPI  LIV     Birth Comments: wnl  1 SAB 03/2016           Past Surgical History:  Procedure Laterality Date   BREAST BIOPSY     wisdom teeth ext     Social History   Socioeconomic History   Marital status: Significant Other    Spouse name: Not on file   Number of children: Not on file   Years of education: Not on file   Highest education level: Not on file  Occupational History   Not on file  Tobacco Use   Smoking status: Never   Smokeless tobacco: Never  Substance and Sexual Activity   Alcohol use: Yes    Alcohol/week: 0.0 standard drinks  of alcohol    Comment: only special occasions   Drug use: No   Sexual activity: Yes    Partners: Male    Birth control/protection: None  Other Topics Concern   Not on file  Social History Narrative   Not on file   Social Determinants of Health   Financial Resource Strain: Low Risk  (01/05/2023)   Received from [facility], [facility]   Overall Financial Resource Strain (CARDIA)    Difficulty of Paying Living Expenses: Not hard at all  Food Insecurity: No Food Insecurity (01/05/2023)   Received from [facility], [facility]   Hunger Vital Sign    Worried About Running Out of Food in the Last Year: Never true    Ran Out of Food in the Last Year: Never true  Transportation Needs: No Transportation Needs (01/05/2023)   Received from [facility], [facility]   PRAPARE - Transportation    Lack of Transportation (Medical): No    Lack of Transportation (Non-Medical): No  Physical Activity: Insufficiently Active (01/05/2023)   Received from [facility], [facility]   Exercise Vital Sign    Days of Exercise per Week: 2 days    Minutes  of Exercise per Session: 10 min  Stress: No Stress Concern Present (01/05/2023)   Received from [facility], [facility] - Occupational Stress Questionnaire    Feeling of Stress : Not at all  Social Connections: Socially Integrated (01/05/2023)   Received from [facility], [facility]   Social Network    How would you rate your social network (family, work, friends)?: Good participation with social networks  Intimate Partner Violence: Not At Risk (01/05/2023)   Received from [facility], [facility]   HITS    Over the last 12 months how often did your partner physically hurt you?: 1    Over the last 12 months how often did your partner insult you or talk down to you?: 1    Over the last 12 months how often did your partner threaten you with physical harm?: 1    Over the last 12 months how  often did your partner scream or curse at you?: 1   Current Facility-Administered Medications on File Prior to Encounter  Medication Dose Route Frequency Provider Last Rate Last Admin   [START ON 08/09/2023] doxycycline (VIBRAMYCIN) 200 mg in dextrose 5 % 250 mL IVPB  200 mg Intravenous On Call to OR Constant, Peggy, MD       Current Outpatient Medications on File Prior to Encounter  Medication Sig Dispense Refill   [DISCONTINUED] Ferrous Fumarate (HEMOCYTE - 106 MG FE) 324 (106 Fe) MG TABS tablet Take 1 tablet (106 mg of iron total) by mouth 2 (two) times daily. (Patient not taking: Reported on 10/07/2018) 60 tablet 3   No Known Allergies  ROS: Pertinent items in HPI  OBJECTIVE BP 117/77   Pulse 74   Temp 98 F (36.7 C) (Oral)   Resp 18   Ht 5\' 5"  (1.651 m)   Wt 181 lb 3.2 oz (82.2 kg)   LMP  (LMP Unknown)   SpO2 100%   BMI 30.15 kg/m  CONSTITUTIONAL: Well-developed, well-nourished female in no acute distress.   NECK: Normal range of motion, supple, no masses.  Normal thyroid.  SKIN: Skin is warm and dry. No rash noted. Not diaphoretic. No erythema. No pallor. NEUROLGIC: Alert and oriented to person, place, and time. Normal reflexes, muscle tone coordination. No cranial nerve deficit noted. PSYCHIATRIC: Normal mood and affect. Normal behavior. Normal judgment and thought content. CARDIOVASCULAR: Normal heart rate noted RESPIRATORY: Effort and breath sounds normal, no problems with respiration noted. ABDOMEN: Soft, normal bowel sounds, no distention noted.  No tenderness, rebound or guarding.  PELVIC: Normal appearing external genitalia; normal appearing vaginal mucosa and cervix. Small amount of blood in vagina, closed cervix, no active bleeding.  RN present as Biomedical engineer. MUSCULOSKELETAL: Normal range of motion. No tenderness.  No cyanosis, clubbing, or edema.  2+ distal pulses.  LAB RESULTS Results for orders placed or performed during the hospital encounter of 08/07/23 (from  the past 48 hour(s))  Urinalysis, Routine w reflex microscopic -Urine, Clean Catch     Status: Abnormal   Collection Time: 08/07/23  7:49 PM  Result Value Ref Range   Color, Urine YELLOW YELLOW   APPearance HAZY (A) CLEAR   Specific Gravity, Urine 1.016 1.005 - 1.030   pH 5.0 5.0 - 8.0   Glucose, UA NEGATIVE NEGATIVE mg/dL   Hgb urine dipstick LARGE (A) NEGATIVE   Bilirubin Urine NEGATIVE NEGATIVE   Ketones, ur NEGATIVE NEGATIVE mg/dL   Protein, ur 30 (A) NEGATIVE mg/dL  Nitrite NEGATIVE NEGATIVE   Leukocytes,Ua NEGATIVE NEGATIVE   RBC / HPF >50 0 - 5 RBC/hpf   WBC, UA 6-10 0 - 5 WBC/hpf   Bacteria, UA NONE SEEN NONE SEEN   Squamous Epithelial / HPF 0-5 0 - 5 /HPF   Mucus PRESENT     Comment: Performed at [facility], [address], [place], [place] [address]    IMAGING [place] Transvaginal  Result Date: 08/07/2023 CLINICAL DATA:  First trimester spotting, molar pregnancy EXAM: TRANSVAGINAL OB ULTRASOUND TECHNIQUE: Transvaginal ultrasound was performed for complete evaluation of the gestation as well as the maternal uterus, adnexal regions, and pelvic cul-de-sac. COMPARISON:  08/01/2023 FINDINGS: Intrauterine gestational sac: Not visualized Yolk sac:  Not visualized Embryo:  Not visualized Subchorionic hemorrhage:  None visualized. Maternal uterus/adnexae: Endometrial thickness measures 17.4 mm maximum, decreased compared with 24 mm previously. Resolution of previously noted cystic areas within the endometrium. Ovaries are within normal limits. Right ovary measures 3.5 x 2.3 x 2.6 cm. Left ovary measures 3.5 x 1.9 x 2 cm. IMPRESSION: 1. No IUP identified. 2. Residual endometrial thickening up to 17.4 mm, decreased compared to prior, and with resolution of previously noted cystic endometrial changes. Sonographer indicates patient is scheduled for D and C 08/09/2023. Electronically Signed   By: Jasmine Shelburne M.D.   On: 08/07/2023 22:24   US OB LESS THAN 14 WEEKS WITH OB  TRANSVAGINAL  Result Date: 08/01/2023 CLINICAL DATA:  Pain in vaginal bleeding affecting early pregnancy. LMP 06/03/2023. Beta HCG unknown. EXAM: OBSTETRIC <14 WK [place] AND TRANSVAGINAL OB US TECHNIQUE: Both transabdominal and transvaginal ultrasound examinations were performed for complete evaluation of the gestation as well as the maternal uterus, adnexal regions, and pelvic cul-de-sac. Transvaginal technique was performed to assess early pregnancy. COMPARISON:  None Available. FINDINGS: Intrauterine gestational sac: None Yolk sac:  Not Visualized. Embryo:  Not Visualized. Cardiac Activity: Not Visualized. Heart Rate: Not applicable MSD: Not applicable Subchorionic hemorrhage:  Not applicable Maternal uterus/adnexae: Thickened endometrium measuring approximately 24 mm with cystic areas. Normal ovaries. No free fluid. IMPRESSION: Thickened endometrium without visualized intrauterine gestational sac. In the setting of positive pregnancy test, this reflects a pregnancy of unknown location. Differential considerations include early normal IUP, abnormal IUP, or nonvisualized ectopic pregnancy. Differentiation is achieved with serial beta HCG supplemented by repeat sonography as clinically warranted. Electronically Signed   By: Minerva Fester M.D.   On: 08/01/2023 21:11    MAU COURSE Stable MSE, tissue passed at home sent to pathology Repeat pelvic ultrasound ordered.  ASSESSMENT 1. Abnormal pregnancy - suspected molar pregnancy     PLAN Hemodynamically stable, no active bleeding. No need for urgent surgery. I did try to see if this can be added on but Main OR was busy with two back to back Level 1 traumas, unable to schedule in time. Some tissue passed, cannot rule out retained tissue. Will follow up pathology results. Patient to come back as planned for D&E on 08/09/23; bleeding precautions reviewed. Reviewed preoperative instructions. Discharged to home in stable condition.   Allergies as of 08/07/2023    No Known Allergies      Medication List    You have not been prescribed any medications.     Evaluation does not show pathology that would require ongoing emergent intervention or inpatient treatment. Patient is hemodynamically stable and mentating appropriately. Discussed findings and plan with patient, who agrees with care plan. All questions answered. Return precautions discussed and outpatient follow up recommendations  given.  Tereso Newcomer, MD 08/08/2023 12:26 AM

## 2023-08-09 ENCOUNTER — Encounter (HOSPITAL_COMMUNITY): Payer: Self-pay | Admitting: Obstetrics and Gynecology

## 2023-08-09 ENCOUNTER — Telehealth: Payer: Self-pay

## 2023-08-09 ENCOUNTER — Other Ambulatory Visit: Payer: Self-pay

## 2023-08-09 ENCOUNTER — Ambulatory Visit (HOSPITAL_COMMUNITY)
Admission: RE | Admit: 2023-08-09 | Payer: Medicaid Other | Source: Ambulatory Visit | Admitting: Obstetrics and Gynecology

## 2023-08-09 ENCOUNTER — Encounter (HOSPITAL_COMMUNITY)
Admission: RE | Disposition: A | Discharge status: Home / Self Care | Payer: Self-pay | Source: Home / Self Care | Attending: Obstetrics and Gynecology

## 2023-08-09 ENCOUNTER — Ambulatory Visit (HOSPITAL_COMMUNITY)
Admission: RE | Admit: 2023-08-09 | Discharge: 2023-08-09 | Disposition: A | Discharge status: Home / Self Care | Payer: Medicaid Other | Attending: Obstetrics and Gynecology | Admitting: Obstetrics and Gynecology

## 2023-08-09 DIAGNOSIS — Z539 Procedure and treatment not carried out, unspecified reason: Secondary | ICD-10-CM | POA: Insufficient documentation

## 2023-08-09 DIAGNOSIS — O020 Blighted ovum and nonhydatidiform mole: Secondary | ICD-10-CM | POA: Diagnosis present

## 2023-08-09 DIAGNOSIS — Z01812 Encounter for preprocedural laboratory examination: Secondary | ICD-10-CM

## 2023-08-09 SURGERY — DILATION AND EVACUATION, UTERUS
Anesthesia: Choice

## 2023-08-09 MED ORDER — LACTATED RINGERS IV SOLN: INTRAVENOUS | Status: DC

## 2023-08-09 MED ORDER — MIDAZOLAM HCL 2 MG/2ML IJ SOLN
INTRAMUSCULAR | Status: AC
  Filled 2023-08-09: qty 2

## 2023-08-09 MED ORDER — ORAL CARE MOUTH RINSE: 15.0000 mL | Freq: Once | OROMUCOSAL | Status: DC

## 2023-08-09 MED ORDER — POVIDONE-IODINE 10 % EX SWAB: 2.0000 | Freq: Once | CUTANEOUS | Status: DC

## 2023-08-09 MED ORDER — FENTANYL CITRATE (PF) 250 MCG/5ML IJ SOLN
INTRAMUSCULAR | Status: AC
  Filled 2023-08-09: qty 5

## 2023-08-09 MED ORDER — PROPOFOL 10 MG/ML IV BOLUS
INTRAVENOUS | Status: AC
  Filled 2023-08-09: qty 20

## 2023-08-09 MED ORDER — CHLORHEXIDINE GLUCONATE 0.12 % MT SOLN
15.0000 mL | Freq: Once | OROMUCOSAL | Status: DC
  Filled 2023-08-09: qty 15

## 2023-08-09 NOTE — Progress Notes (Signed)
Per Dr. Donavan Foil, pt will be rescheduled to tomorrow for surgery. Pt notified of new arrival time for tomorrow per OR desk. Pt sent home with CHG soap and instructions. All concerns addressed at this time. Pt d/c'd to home with fiance.   Viviano Simas, RN

## 2023-08-09 NOTE — Pre-Procedure Instructions (Signed)
Surgical Instructions    Your procedure is scheduled on Tuesday, September 10th.  Report to [facility] Main Entrance "A" at 9:00 A.M., then check in with the Admitting office.  Call this number if you have problems the morning of surgery:  [phone]  If you have any questions prior to your surgery date call [phone]: Open Monday-Friday 8am-4pm If you experience any cold or flu symptoms such as cough, fever, chills, shortness of breath, etc. between now and your scheduled surgery, please notify us at the above number.     Remember:  Do not eat or drink after midnight the night before your surgery    Take these medicines the morning of surgery with A SIP OF WATER: NONE  As of today, STOP taking any Aspirin (unless otherwise instructed by your surgeon) Aleve, Naproxen, Ibuprofen, Motrin, Advil, Goody's, BC's, all herbal medications, fish oil, and all vitamins.                     Do NOT Smoke (Tobacco/Vaping) for 24 hours prior to your procedure.  If you use a CPAP at night, you may bring your mask/headgear for your overnight stay.   Contacts, glasses, piercing's, hearing aid's, dentures or partials may not be worn into surgery, please bring cases for these belongings.    For patients admitted to the hospital, discharge time will be determined by your treatment team.   Patients discharged the day of surgery will not be allowed to drive home, and someone needs to stay with them for 24 hours.  SURGICAL WAITING ROOM VISITATION Patients having surgery or a procedure may have no more than 2 support people in the waiting area - these visitors may rotate.   Children under the age of 93 must have an adult with them who is not the patient. If the patient needs to stay at the hospital during part of their recovery, the visitor guidelines for inpatient rooms apply. Pre-op nurse will coordinate an appropriate time for 1 support person to accompany patient in pre-op.  This support person  may not rotate.   Please refer to the [facility] website for the visitor guidelines for Inpatients (after your surgery is over and you are in a regular room).    Special instructions:   [place]- Preparing For Surgery  Before surgery, you can play an important role. Because skin is not sterile, your skin needs to be as free of germs as possible. You can reduce the number of germs on your skin by washing with CHG (chlorahexidine gluconate) Soap before surgery.  CHG is an antiseptic cleaner which kills germs and bonds with the skin to continue killing germs even after washing.    Oral Hygiene is also important to reduce your risk of infection.  Remember - BRUSH YOUR TEETH THE MORNING OF SURGERY WITH YOUR REGULAR TOOTHPASTE  Please do not use if you have an allergy to CHG or antibacterial soaps. If your skin becomes reddened/irritated stop using the CHG.  Do not shave (including legs and underarms) for at least 48 hours prior to first CHG shower. It is OK to shave your face.  Please follow these instructions carefully.   Shower the NIGHT BEFORE SURGERY and the MORNING OF SURGERY  If you chose to wash your hair, wash your hair first as usual with your normal shampoo.  After you shampoo, rinse your hair and body thoroughly to remove the shampoo.  Use CHG Soap as you would any other liquid soap.  You can apply CHG directly to the skin and wash gently with a scrungie or a clean washcloth.   Apply the CHG Soap to your body ONLY FROM THE NECK DOWN.  Do not use on open wounds or open sores. Avoid contact with your eyes, ears, mouth and genitals (private parts). Wash Face and genitals (private parts)  with your normal soap.   Wash thoroughly, paying special attention to the area where your surgery will be performed.  Thoroughly rinse your body with warm water from the neck down.  DO NOT shower/wash with your normal soap after using and rinsing off the CHG Soap.  Pat yourself dry with a CLEAN  TOWEL.  Wear CLEAN PAJAMAS to bed the night before surgery  Place CLEAN SHEETS on your bed the night before your surgery  DO NOT SLEEP WITH PETS.   Day of Surgery: Take a shower with CHG soap. Do not wear jewelry or makeup Do not wear lotions, powders, perfumes/colognes, or deodorant. Do not shave 48 hours prior to surgery.  Men may shave face and neck. Do not bring valuables to the hospital.  [facility] is not responsible for any belongings or valuables. Do not wear nail polish, gel polish, artificial nails, or any other type of covering on natural nails (fingers and toes) If you have artificial nails or gel coating that need to be removed by a nail salon, please have this removed prior to surgery. Artificial nails or gel coating may interfere with anesthesia's ability to adequately monitor your vital signs. Wear Clean/Comfortable clothing the morning of surgery Remember to brush your teeth WITH YOUR REGULAR TOOTHPASTE.   Please read over the following fact sheets that you were given.    If you received a COVID test during your pre-op visit  it is requested that you wear a mask when out in public, stay away from anyone that may not be feeling well and notify your surgeon if you develop symptoms. If you have been in contact with anyone that has tested positive in the last 10 days please notify you surgeon.

## 2023-08-09 NOTE — Telephone Encounter (Signed)
Called patient to advise that her procedure was rescheduled for 08/10/23 @[facility] at 11 am and to arrive by 9 am. Patient agreed and understand pre-op instructions.

## 2023-08-10 ENCOUNTER — Ambulatory Visit (HOSPITAL_COMMUNITY): Payer: Self-pay | Admitting: Anesthesiology

## 2023-08-10 ENCOUNTER — Ambulatory Visit (HOSPITAL_COMMUNITY)
Admission: RE | Admit: 2023-08-10 | Discharge: 2023-08-10 | Disposition: A | Discharge status: Home / Self Care | Payer: Medicaid Other | Attending: Obstetrics & Gynecology | Admitting: Obstetrics & Gynecology

## 2023-08-10 ENCOUNTER — Other Ambulatory Visit (HOSPITAL_COMMUNITY): Payer: Medicaid Other

## 2023-08-10 ENCOUNTER — Other Ambulatory Visit: Payer: Self-pay

## 2023-08-10 ENCOUNTER — Encounter (HOSPITAL_COMMUNITY)
Admission: RE | Disposition: A | Discharge status: Home / Self Care | Payer: Self-pay | Source: Home / Self Care | Attending: Obstetrics & Gynecology

## 2023-08-10 ENCOUNTER — Ambulatory Visit (HOSPITAL_BASED_OUTPATIENT_CLINIC_OR_DEPARTMENT_OTHER): Payer: Medicaid Other | Admitting: Anesthesiology

## 2023-08-10 ENCOUNTER — Encounter (HOSPITAL_COMMUNITY): Payer: Self-pay | Admitting: Obstetrics & Gynecology

## 2023-08-10 ENCOUNTER — Ambulatory Visit (HOSPITAL_COMMUNITY)
Admit: 2023-08-10 | Discharge: 2023-08-10 | Disposition: A | Discharge status: Home / Self Care | Payer: Medicaid Other | Attending: Obstetrics and Gynecology | Admitting: Obstetrics and Gynecology

## 2023-08-10 DIAGNOSIS — O019 Hydatidiform mole, unspecified: Secondary | ICD-10-CM | POA: Diagnosis not present

## 2023-08-10 DIAGNOSIS — Z3A08 8 weeks gestation of pregnancy: Secondary | ICD-10-CM

## 2023-08-10 DIAGNOSIS — Z3A00 Weeks of gestation of pregnancy not specified: Secondary | ICD-10-CM

## 2023-08-10 DIAGNOSIS — O020 Blighted ovum and nonhydatidiform mole: Secondary | ICD-10-CM

## 2023-08-10 DIAGNOSIS — O034 Incomplete spontaneous abortion without complication: Secondary | ICD-10-CM

## 2023-08-10 HISTORY — PX: OPERATIVE ULTRASOUND: SHX5996

## 2023-08-10 HISTORY — PX: DILATION AND EVACUATION: SHX1459

## 2023-08-10 LAB — TYPE AND SCREEN
ABO/RH(D): B POS
Antibody Screen: NEGATIVE

## 2023-08-10 LAB — CBC
HCT: 39.3 % (ref 36.0–46.0)
Hemoglobin: 12.3 g/dL (ref 12.0–15.0)
MCH: 25 pg — ABNORMAL LOW (ref 26.0–34.0)
MCHC: 31.3 g/dL (ref 30.0–36.0)
MCV: 79.9 fL — ABNORMAL LOW (ref 80.0–100.0)
Platelets: 366 10*3/uL (ref 150–400)
RBC: 4.92 MIL/uL (ref 3.87–5.11)
RDW: 12.7 % (ref 11.5–15.5)
WBC: 7 10*3/uL (ref 4.0–10.5)
nRBC: 0 % (ref 0.0–0.2)

## 2023-08-10 LAB — HCG, QUANTITATIVE, PREGNANCY: hCG, Beta Chain, Quant, S: 2559 m[IU]/mL — ABNORMAL HIGH (ref <5)

## 2023-08-10 LAB — SURGICAL PATHOLOGY

## 2023-08-10 SURGERY — DILATION AND EVACUATION, UTERUS
Anesthesia: General

## 2023-08-10 MED ORDER — PROPOFOL 10 MG/ML IV BOLUS
INTRAVENOUS | Status: AC
  Filled 2023-08-10: qty 20

## 2023-08-10 MED ORDER — SCOPOLAMINE 1 MG/3DAYS TD PT72
1.0000 | MEDICATED_PATCH | TRANSDERMAL | Status: DC
  Administered 2023-08-10: 1.5 mg via TRANSDERMAL
  Filled 2023-08-10: qty 1

## 2023-08-10 MED ORDER — SOD CITRATE-CITRIC ACID 500-334 MG/5ML PO SOLN
30.0000 mL | ORAL | Status: AC
  Administered 2023-08-10: 30 mL via ORAL
  Filled 2023-08-10: qty 30

## 2023-08-10 MED ORDER — ONDANSETRON HCL 4 MG/2ML IJ SOLN
INTRAMUSCULAR | Status: DC | PRN
  Administered 2023-08-10: 4 mg via INTRAVENOUS

## 2023-08-10 MED ORDER — PROPOFOL 10 MG/ML IV BOLUS
INTRAVENOUS | Status: DC | PRN
  Administered 2023-08-10: 200 mg via INTRAVENOUS

## 2023-08-10 MED ORDER — BUPIVACAINE HCL (PF) 0.5 % IJ SOLN
INTRAMUSCULAR | Status: DC | PRN
  Administered 2023-08-10: 10 mL

## 2023-08-10 MED ORDER — FENTANYL CITRATE (PF) 250 MCG/5ML IJ SOLN
INTRAMUSCULAR | Status: AC
  Filled 2023-08-10: qty 5

## 2023-08-10 MED ORDER — DEXAMETHASONE SODIUM PHOSPHATE 10 MG/ML IJ SOLN
INTRAMUSCULAR | Status: DC | PRN
  Administered 2023-08-10: 10 mg via INTRAVENOUS

## 2023-08-10 MED ORDER — DROPERIDOL 2.5 MG/ML IJ SOLN: 0.6250 mg | Freq: Once | INTRAMUSCULAR | Status: DC | PRN

## 2023-08-10 MED ORDER — PROPOFOL 500 MG/50ML IV EMUL
INTRAVENOUS | Status: DC | PRN
Start: 2023-08-10 — End: 2023-08-10
  Administered 2023-08-10: 150 ug/kg/min via INTRAVENOUS

## 2023-08-10 MED ORDER — DOXYCYCLINE HYCLATE 100 MG IV SOLR
200.0000 mg | INTRAVENOUS | Status: AC
  Administered 2023-08-10: 200 mg via INTRAVENOUS
  Filled 2023-08-10: qty 200

## 2023-08-10 MED ORDER — ACETAMINOPHEN 160 MG/5ML PO SOLN: 325.0000 mg | ORAL | Status: DC | PRN

## 2023-08-10 MED ORDER — FENTANYL CITRATE (PF) 100 MCG/2ML IJ SOLN: 25.0000 ug | INTRAMUSCULAR | Status: DC | PRN

## 2023-08-10 MED ORDER — LIDOCAINE 2% (20 MG/ML) 5 ML SYRINGE
INTRAMUSCULAR | Status: AC
  Filled 2023-08-10: qty 5

## 2023-08-10 MED ORDER — ACETAMINOPHEN 325 MG PO TABS: 325.0000 mg | ORAL_TABLET | ORAL | Status: DC | PRN

## 2023-08-10 MED ORDER — OXYCODONE HCL 5 MG/5ML PO SOLN: 5.0000 mg | Freq: Once | ORAL | Status: DC | PRN

## 2023-08-10 MED ORDER — POVIDONE-IODINE 10 % EX SWAB
2.0000 | Freq: Once | CUTANEOUS | Status: AC
  Administered 2023-08-10: 2 via TOPICAL

## 2023-08-10 MED ORDER — TRANEXAMIC ACID-NACL 1000-0.7 MG/100ML-% IV SOLN
1000.0000 mg | Freq: Once | INTRAVENOUS | Status: AC
  Administered 2023-08-10: 1000 mg via INTRAVENOUS
  Filled 2023-08-10: qty 100

## 2023-08-10 MED ORDER — CHLORHEXIDINE GLUCONATE 0.12 % MT SOLN
OROMUCOSAL | Status: AC
  Administered 2023-08-10: 15 mL
  Filled 2023-08-10: qty 15

## 2023-08-10 MED ORDER — ONDANSETRON HCL 4 MG/2ML IJ SOLN
INTRAMUSCULAR | Status: AC
  Filled 2023-08-10: qty 2

## 2023-08-10 MED ORDER — LIDOCAINE 2% (20 MG/ML) 5 ML SYRINGE
INTRAMUSCULAR | Status: DC | PRN
  Administered 2023-08-10: 40 mg via INTRAVENOUS

## 2023-08-10 MED ORDER — TRANEXAMIC ACID-NACL 1000-0.7 MG/100ML-% IV SOLN: 1000.0000 mg | Freq: Once | INTRAVENOUS | Status: DC

## 2023-08-10 MED ORDER — OXYCODONE HCL 5 MG PO TABS: 5.0000 mg | ORAL_TABLET | Freq: Once | ORAL | Status: DC | PRN

## 2023-08-10 MED ORDER — PROMETHAZINE HCL 25 MG/ML IJ SOLN: 6.2500 mg | INTRAMUSCULAR | Status: DC | PRN

## 2023-08-10 MED ORDER — BUPIVACAINE HCL (PF) 0.5 % IJ SOLN
INTRAMUSCULAR | Status: AC
  Filled 2023-08-10: qty 30

## 2023-08-10 MED ORDER — FENTANYL CITRATE (PF) 250 MCG/5ML IJ SOLN
INTRAMUSCULAR | Status: DC | PRN
  Administered 2023-08-10 (×3): 50 ug via INTRAVENOUS

## 2023-08-10 MED ORDER — LACTATED RINGERS IV SOLN: INTRAVENOUS | Status: DC

## 2023-08-10 MED ORDER — ACETAMINOPHEN 500 MG PO TABS
1000.0000 mg | ORAL_TABLET | ORAL | Status: AC
  Administered 2023-08-10: 1000 mg via ORAL
  Filled 2023-08-10: qty 2

## 2023-08-10 MED ORDER — MIDAZOLAM HCL 2 MG/2ML IJ SOLN
INTRAMUSCULAR | Status: AC
  Filled 2023-08-10: qty 2

## 2023-08-10 MED ORDER — DEXAMETHASONE SODIUM PHOSPHATE 10 MG/ML IJ SOLN
INTRAMUSCULAR | Status: AC
  Filled 2023-08-10: qty 1

## 2023-08-10 MED ORDER — MIDAZOLAM HCL 2 MG/2ML IJ SOLN
INTRAMUSCULAR | Status: DC | PRN
  Administered 2023-08-10: 2 mg via INTRAVENOUS

## 2023-08-10 MED ORDER — ACETAMINOPHEN 10 MG/ML IV SOLN: 1000.0000 mg | Freq: Once | INTRAVENOUS | Status: DC | PRN

## 2023-08-10 SURGICAL SUPPLY — 20 items
CATH ROBINSON RED A/P 16FR (CATHETERS) ×1 IMPLANT
FILTER UTR ASPR ASSEMBLY (MISCELLANEOUS) ×1 IMPLANT
GLOVE BIO SURGEON STRL SZ 6.5 (GLOVE) ×1 IMPLANT
GLOVE BIOGEL PI IND STRL 7.0 (GLOVE) ×2 IMPLANT
GOWN STRL REUS W/ TWL LRG LVL3 (GOWN DISPOSABLE) ×2 IMPLANT
GOWN STRL REUS W/TWL LRG LVL3 (GOWN DISPOSABLE) ×2
HOSE CONNECTING 18IN BERKELEY (TUBING) ×1 IMPLANT
KIT BERKELEY 1ST TRI 3/8 NO TR (MISCELLANEOUS) ×1 IMPLANT
KIT BERKELEY 1ST TRIMESTER 3/8 (MISCELLANEOUS) ×1 IMPLANT
NS IRRIG 1000ML POUR BTL (IV SOLUTION) ×1 IMPLANT
PACK VAGINAL MINOR WOMEN LF (CUSTOM PROCEDURE TRAY) ×1 IMPLANT
PAD OB MATERNITY 4.3X12.25 (PERSONAL CARE ITEMS) ×1 IMPLANT
SET BERKELEY SUCTION TUBING (SUCTIONS) IMPLANT
SPIKE FLUID TRANSFER (MISCELLANEOUS) IMPLANT
TOWEL GREEN STERILE FF (TOWEL DISPOSABLE) ×2 IMPLANT
UNDERPAD 30X36 HEAVY ABSORB (UNDERPADS AND DIAPERS) ×1 IMPLANT
VACURETTE 10 RIGID CVD (CANNULA) IMPLANT
VACURETTE 7MM CVD STRL WRAP (CANNULA) IMPLANT
VACURETTE 8 RIGID CVD (CANNULA) IMPLANT
VACURETTE 9 RIGID CVD (CANNULA) IMPLANT

## 2023-08-10 NOTE — Anesthesia Procedure Notes (Signed)
Procedure Name: LMA Insertion Date/Time: 08/10/2023 11:15 AM  Performed by: Kayleen Memos, CRNAPre-anesthesia Checklist: Patient identified, Emergency Drugs available, Suction available and Patient being monitored Patient Re-evaluated:Patient Re-evaluated prior to induction Oxygen Delivery Method: Circle System Utilized Preoxygenation: Pre-oxygenation with 100% oxygen Induction Type: IV induction Ventilation: Mask ventilation without difficulty LMA: LMA inserted LMA Size: 4.0 Number of attempts: 1 Airway Equipment and Method: Bite block Placement Confirmation: positive ETCO2 Tube secured with: Tape Dental Injury: Teeth and Oropharynx as per pre-operative assessment

## 2023-08-10 NOTE — H&P (Signed)
Misty Kelly is an 32 y.o. female. [id number] Unknown Patient is scheduled for suction D&E for molar pregnancy. She passed some tissue at MAU 2 days ago and pathology report showed molar pregnancy  Pertinent Gynecological History: OB History: G3P1011   Menstrual History:  No LMP recorded (lmp unknown).    Past Medical History:  Diagnosis Date   Allergy    STD (sexually transmitted disease) 01/2014   Tx'd for Chlamydia--did have neg. test of cure   Urinary incontinence     Past Surgical History:  Procedure Laterality Date   BREAST BIOPSY     wisdom teeth ext      Family History  Problem Relation Age of Onset   Hyperlipidemia Brother    Hypertension Brother    Cancer Maternal Grandfather        Lung Ca   Stroke Paternal Grandfather    Cancer Maternal Grandmother 53        Dec Esophogeal Ca   Hypertension Mother     Social History:  reports that she has never smoked. She has never used smokeless tobacco. She reports current alcohol use. She reports that she does not use drugs.  Allergies: No Known Allergies  No medications prior to admission.    Review of Systems  Constitutional: Negative.   Respiratory: Negative.    Cardiovascular: Negative.   Genitourinary:  Negative for vaginal bleeding.    Blood pressure 113/76, pulse 74, temperature 98.2 F (36.8 C), temperature source Oral, resp. rate 17, height 5\' 5"  (1.651 m), weight 82.1 kg, SpO2 98%. Physical Exam Vitals and nursing note reviewed. Exam conducted with a chaperone present.  Constitutional:      Appearance: Normal appearance. She is not ill-appearing.  HENT:     Head: Normocephalic and atraumatic.  Cardiovascular:     Rate and Rhythm: Normal rate.  Pulmonary:     Effort: Pulmonary effort is normal.  Abdominal:     General: Abdomen is flat.  Skin:    General: Skin is warm and dry.  Neurological:     General: No focal deficit present.     Mental Status: She is alert.  Psychiatric:        Mood and  Affect: Mood normal.        Behavior: Behavior normal.     No results found for this or any previous visit (from the past 24 hour(s)).  CBC    Component Value Date/Time   WBC 7.0 08/10/2023 0950   RBC 4.92 08/10/2023 0950   HGB 12.3 08/10/2023 0950   HGB 12.7 12/06/2017 0000   HCT 39.3 08/10/2023 0950   PLT 366 08/10/2023 0950   MCV 79.9 (L) 08/10/2023 0950   MCH 25.0 (L) 08/10/2023 0950   MCHC 31.3 08/10/2023 0950   RDW 12.7 08/10/2023 0950   LYMPHSABS 1.9 10/07/2018 1044   MONOABS 0.8 10/07/2018 1044   EOSABS 0.1 10/07/2018 1044   BASOSABS 0.0 10/07/2018 1044     Assessment/Plan: Molar pregnancy for D&E. The risks of surgery were discussed in detail with the patient including but not limited to: bleeding which may require transfusion or reoperation; infection which may require prolonged hospitalization or re-hospitalization and antibiotic therapy; injury to bowel, bladder, ureters and major vessels or other surrounding organs which may lead to other procedures; formation of adhesions; need for additional procedures including laparotomy or subsequent procedures secondary to intraoperative injury or abnormal pathology; thromboembolic phenomenon; incisional problems and other postoperative or anesthesia complications.  Patient was told that the  likelihood that her condition and symptoms will be treated effectively with this surgical management was very high; the postoperative expectations were also discussed in detail. The patient also understands the alternative treatment options which were discussed in full. All questions were answered.    Scheryl Darter 08/10/2023, 9:49 AM

## 2023-08-10 NOTE — Anesthesia Preprocedure Evaluation (Addendum)
Anesthesia Evaluation  Patient identified by MRN, date of birth, ID band Patient awake    Reviewed: Allergy & Precautions, NPO status , Patient's Chart, lab work & pertinent test results  Airway Mallampati: II  TM Distance: >3 FB Neck ROM: Full    Dental  (+) Teeth Intact, Dental Advisory Given   Pulmonary neg pulmonary ROS   breath sounds clear to auscultation       Cardiovascular negative cardio ROS  Rhythm:Regular Rate:Normal     Neuro/Psych  Neuromuscular disease  negative psych ROS   GI/Hepatic negative GI ROS, Neg liver ROS,,,  Endo/Other  negative endocrine ROS    Renal/GU negative Renal ROS     Musculoskeletal negative musculoskeletal ROS (+)    Abdominal   Peds  Hematology  (+) Blood dyscrasia, anemia   Anesthesia Other Findings   Reproductive/Obstetrics                             Anesthesia Physical Anesthesia Plan  ASA: 2  Anesthesia Plan: General   Post-op Pain Management: Tylenol PO (pre-op)*   Induction: Intravenous  PONV Risk Score and Plan: 4 or greater and Ondansetron, Dexamethasone, Midazolam and Scopolamine patch - Pre-op  Airway Management Planned: LMA  Additional Equipment: None  Intra-op Plan:   Post-operative Plan: Extubation in OR  Informed Consent: I have reviewed the patients History and Physical, chart, labs and discussed the procedure including the risks, benefits and alternatives for the proposed anesthesia with the patient or authorized representative who has indicated his/her understanding and acceptance.     Dental advisory given  Plan Discussed with: CRNA  Anesthesia Plan Comments:        Anesthesia Quick Evaluation

## 2023-08-10 NOTE — Transfer of Care (Signed)
Immediate Anesthesia Transfer of Care Note  Patient: Misty Kelly  Procedure(s) Performed: DILATATION AND EVACUATION OPERATIVE ULTRASOUND  Patient Location: PACU  Anesthesia Type:General  Level of Consciousness: drowsy  Airway & Oxygen Therapy: Patient Spontanous Breathing and Patient connected to face mask oxygen  Post-op Assessment: Report given to RN and Post -op Vital signs reviewed and stable  Post vital signs: Reviewed and stable  Last Vitals:  Vitals Value Taken Time  BP    Temp    Pulse    Resp    SpO2      Last Pain:  Vitals:   08/10/23 0933  TempSrc:   PainSc: 0-No pain         Complications: No notable events documented.

## 2023-08-10 NOTE — Op Note (Signed)
Misty Kelly PROCEDURE DATE: 08/10/2023  PREOPERATIVE DIAGNOSIS: molar pregnancy POSTOPERATIVE DIAGNOSIS: The same PROCEDURE:     Dilation and Evacuation under US guidance SURGEON:  Scheryl Darter MD  INDICATIONS: 32 y.o. G3P1011 with molar gestation, needing surgical completion.  Risks of surgery were discussed with the patient including but not limited to: bleeding which may require transfusion; infection which may require antibiotics; injury to uterus or surrounding organs; need for additional procedures including laparotomy or laparoscopy; possibility of intrauterine scarring which may impair future fertility; and other postoperative/anesthesia complications. Written informed consent was obtained.    FINDINGS:  A 8 week size uterus, small amounts of products of conception, specimen sent to pathology.  ANESTHESIA:    Monitored intravenous sedation, paracervical block. INTRAVENOUS FLUIDS:  1000 ml of LR ESTIMATED BLOOD LOSS:  Less than 20 ml. SPECIMENS:  Products of conception sent to pathology COMPLICATIONS:  None immediate.  PROCEDURE DETAILS:  The patient received intravenous Doxycycline while in the preoperative area.  She was then taken to the operating room where monitored intravenous sedation was administered and was found to be adequate.  After an adequate timeout was performed, she was placed in the dorsal lithotomy position and examined; then prepped and draped in the sterile manner.   Her bladder was catheterized for an unmeasured amount of clear, yellow urine. A vaginal speculum was then placed in the patient's vagina and a single tooth tenaculum was applied to the anterior lip of the cervix.  A paracervical block using 10 ml of 0.5% Marcaine was administered. The cervix was gently dilated to accommodate a 10 mm suction curette that was gently advanced to the uterine fundus. [place] was used to visualize the uterus intraoperatively.  The suction device was then activated and curette slowly  rotated to clear the uterus of products of conception.  There was minimal bleeding noted and the tenaculum removed with good hemostasis noted.   All instruments were removed from the patient's vagina.  Sponge and instrument counts were correct times two  The patient tolerated the procedure well and was taken to the recovery area awake, and in stable condition.  Adam Phenix, MD 08/10/2023 11:46 AM

## 2023-08-10 NOTE — Anesthesia Postprocedure Evaluation (Signed)
Anesthesia Post Note  Patient: Bereniz Tocci  Procedure(s) Performed: DILATATION AND EVACUATION OPERATIVE ULTRASOUND     Patient location during evaluation: PACU Anesthesia Type: General Level of consciousness: awake and alert Pain management: pain level controlled Vital Signs Assessment: post-procedure vital signs reviewed and stable Respiratory status: spontaneous breathing, nonlabored ventilation, respiratory function stable and patient connected to nasal cannula oxygen Cardiovascular status: blood pressure returned to baseline and stable Postop Assessment: no apparent nausea or vomiting Anesthetic complications: no  No notable events documented.  Last Vitals:  Vitals:   08/10/23 1230 08/10/23 1245  BP: 101/63 109/74  Pulse: (!) 57 (!) 59  Resp: 16 20  Temp:  36.5 C  SpO2: 98% 99%    Last Pain:  Vitals:   08/10/23 1145  TempSrc:   PainSc: Asleep                 Shelton Silvas

## 2023-08-11 ENCOUNTER — Encounter (HOSPITAL_COMMUNITY): Payer: Self-pay | Admitting: Obstetrics & Gynecology

## 2023-08-11 ENCOUNTER — Telehealth: Payer: Self-pay

## 2023-08-11 LAB — SURGICAL PATHOLOGY

## 2023-08-11 NOTE — Telephone Encounter (Signed)
Patient called and given all precautions as detailed by Dr. Macon Large. Patient is schedule to have follow up HCG on Tuesday. Patient made aware the importance of follow up and states understanding. Armandina Stammer RN

## 2023-08-11 NOTE — Telephone Encounter (Signed)
-----   Message from Jaynie Collins sent at 08/11/2023  9:10 AM EDT ----- Regarding: Molar pregnancy For patient's molar pregnancy, she needs weekly labs for hCG levels until negative (HCG <5).  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 After HCG is negative, patients are followed with monthly hCG levels for a total of three months, to ensure the value continues to be negative. Patient should be strongly encouraged to be on contraception during this entire surveillance period.  A new pregnancy during this period would make it difficult or impossible to interpret hCG results and would complicate management.  Conception should only be attempted after this surveillance period. ----- Message ----- From: Interface, Lab In Three Zero One Sent: 08/10/2023   9:31 AM EDT To: Tereso Newcomer, MD

## 2023-08-17 ENCOUNTER — Ambulatory Visit: Payer: Medicaid Other

## 2023-08-17 DIAGNOSIS — O020 Blighted ovum and nonhydatidiform mole: Secondary | ICD-10-CM

## 2023-08-17 NOTE — Progress Notes (Signed)
Patient sent to lab for blood draw. Armandina Stammer RN

## 2023-08-17 NOTE — Addendum Note (Signed)
Addended by: Anell Barr on: 08/17/2023 08:14 AM   Modules accepted: Orders

## 2023-08-18 ENCOUNTER — Telehealth: Payer: Self-pay

## 2023-08-18 NOTE — Telephone Encounter (Signed)
Patient called and given results. Patient to come back for hcg blood draw next week. Armandina Stammer RN

## 2023-08-18 NOTE — Telephone Encounter (Signed)
-----   Message from Levie Heritage sent at 08/18/2023  8:19 AM EDT ----- Needs to return in 1 week

## 2023-08-24 ENCOUNTER — Ambulatory Visit: Payer: Medicaid Other

## 2023-08-24 DIAGNOSIS — O24111 Pre-existing diabetes mellitus, type 2, in pregnancy, first trimester: Secondary | ICD-10-CM | POA: Insufficient documentation

## 2023-08-24 DIAGNOSIS — O020 Blighted ovum and nonhydatidiform mole: Secondary | ICD-10-CM

## 2023-08-24 DIAGNOSIS — Z3A01 Less than 8 weeks gestation of pregnancy: Secondary | ICD-10-CM

## 2023-08-24 NOTE — Progress Notes (Signed)
Patient presents for HCG. Patient was sent to the lab to have lab drawn. Osmani Kersten l Lillianne Eick, CMA

## 2023-08-25 LAB — BETA HCG QUANT (REF LAB): hCG Quant: 18 m[IU]/mL

## 2023-08-31 ENCOUNTER — Other Ambulatory Visit (INDEPENDENT_AMBULATORY_CARE_PROVIDER_SITE_OTHER): Payer: Medicaid Other

## 2023-08-31 DIAGNOSIS — O020 Blighted ovum and nonhydatidiform mole: Secondary | ICD-10-CM

## 2023-08-31 DIAGNOSIS — Z3A01 Less than 8 weeks gestation of pregnancy: Secondary | ICD-10-CM

## 2023-08-31 NOTE — Progress Notes (Signed)
Patient presents for HCG. Patient sent to lab to have lab drawn.

## 2023-09-01 ENCOUNTER — Encounter: Payer: Medicaid Other | Admitting: Family Medicine

## 2023-09-01 LAB — BETA HCG QUANT (REF LAB): hCG Quant: 5 m[IU]/mL

## 2023-09-06 ENCOUNTER — Encounter (HOSPITAL_COMMUNITY): Payer: Self-pay

## 2023-09-06 ENCOUNTER — Telehealth: Payer: Self-pay

## 2023-09-06 ENCOUNTER — Emergency Department (HOSPITAL_COMMUNITY)
Admission: EM | Admit: 2023-09-06 | Discharge: 2023-09-07 | Discharge status: Against Medical Advice | Payer: Medicaid Other | Attending: Emergency Medicine | Admitting: Emergency Medicine

## 2023-09-06 ENCOUNTER — Other Ambulatory Visit: Payer: Self-pay

## 2023-09-06 ENCOUNTER — Emergency Department (HOSPITAL_COMMUNITY): Payer: Medicaid Other

## 2023-09-06 DIAGNOSIS — N898 Other specified noninflammatory disorders of vagina: Secondary | ICD-10-CM | POA: Insufficient documentation

## 2023-09-06 DIAGNOSIS — Z5321 Procedure and treatment not carried out due to patient leaving prior to being seen by health care provider: Secondary | ICD-10-CM | POA: Insufficient documentation

## 2023-09-06 DIAGNOSIS — R1030 Lower abdominal pain, unspecified: Secondary | ICD-10-CM | POA: Diagnosis present

## 2023-09-06 LAB — URINALYSIS, ROUTINE W REFLEX MICROSCOPIC
Bilirubin Urine: NEGATIVE
Glucose, UA: NEGATIVE mg/dL
Ketones, ur: NEGATIVE mg/dL
Nitrite: NEGATIVE
Protein, ur: NEGATIVE mg/dL
Specific Gravity, Urine: 1.023 (ref 1.005–1.030)
pH: 5 (ref 5.0–8.0)

## 2023-09-06 LAB — HCG, QUANTITATIVE, PREGNANCY: hCG, Beta Chain, Quant, S: 4 m[IU]/mL (ref <5)

## 2023-09-06 LAB — HIV ANTIBODY (ROUTINE TESTING W REFLEX): HIV Screen 4th Generation wRfx: NONREACTIVE

## 2023-09-06 MED ORDER — ACETAMINOPHEN 500 MG PO TABS
1000.0000 mg | ORAL_TABLET | Freq: Once | ORAL | Status: AC
  Administered 2023-09-06: 1000 mg via ORAL
  Filled 2023-09-06: qty 2

## 2023-09-06 NOTE — Telephone Encounter (Signed)
Patient called stating she had some abdominal cramping after intercourse last night. Patient requesting an appt with Dr. Adrian Blackwater. Advised patient Dr. Adrian Blackwater is off this week. Offered patient an appt tomorrow with Gerrit Heck, CNM. Patient declines appt and states she will continue to monitor symptoms. Advised patient she can send a my chart message and I will forward to Dr. Adrian Blackwater. Pt declines at this time.

## 2023-09-06 NOTE — ED Provider Triage Note (Signed)
Emergency Medicine Provider Triage Evaluation Note  Misty Kelly , a 32 y.o. female  was evaluated in triage.  Pt complains of lower abdominal pain x2 days. Endorses white vaginal discharge but no vaginal bleeding or dysuria. She had a D&C 9/10 and is getting serial hcg's with her OBGYN, all of which have been down trending.  Review of Systems  Positive: Pelvic pain, right flank pain Negative: Dysuria, changes to bowel habits, fever  Physical Exam  BP 125/80   Pulse 91   Temp 98.9 F (37.2 C) (Oral)   Resp 16   Ht 5\' 5"  (1.651 m)   Wt 82.1 kg   LMP 04/05/2023 (Approximate)   SpO2 97%   BMI 30.12 kg/m  Gen:   Awake, no distress   Resp:  Normal effort  MSK:   Moves extremities without difficulty  Other:  Lower abdominal and right flank tenderness  Medical Decision Making  Medically screening exam initiated at 4:23 PM.  Appropriate orders placed.  Stefan Karen was informed that the remainder of the evaluation will be completed by another provider, this initial triage assessment does not replace that evaluation, and the importance of remaining in the ED until their evaluation is complete.     Maxwell Marion, PA-C 09/06/23 [phone]

## 2023-09-06 NOTE — ED Triage Notes (Signed)
Pt states having pelvic painx2d. Pt has D&C of uterus 08/10/23. Pt denies vaginal bleeding. Pt c/o white vaginal discharge.

## 2023-09-06 NOTE — ED Notes (Signed)
Pt stated she was leaving and going to [facility]. Informed pt when she goes there her wait time still starts over because she leaving against medical advice and is not consider a transfer.

## 2023-09-08 ENCOUNTER — Encounter (HOSPITAL_BASED_OUTPATIENT_CLINIC_OR_DEPARTMENT_OTHER): Payer: Self-pay | Admitting: Emergency Medicine

## 2023-09-08 ENCOUNTER — Other Ambulatory Visit: Payer: Self-pay

## 2023-09-08 ENCOUNTER — Emergency Department (HOSPITAL_BASED_OUTPATIENT_CLINIC_OR_DEPARTMENT_OTHER): Payer: Medicaid Other

## 2023-09-08 ENCOUNTER — Emergency Department (HOSPITAL_BASED_OUTPATIENT_CLINIC_OR_DEPARTMENT_OTHER)
Admission: EM | Admit: 2023-09-08 | Discharge: 2023-09-08 | Disposition: A | Discharge status: Home / Self Care | Payer: Medicaid Other | Attending: Emergency Medicine | Admitting: Emergency Medicine

## 2023-09-08 DIAGNOSIS — N83201 Unspecified ovarian cyst, right side: Secondary | ICD-10-CM | POA: Insufficient documentation

## 2023-09-08 DIAGNOSIS — E119 Type 2 diabetes mellitus without complications: Secondary | ICD-10-CM | POA: Insufficient documentation

## 2023-09-08 DIAGNOSIS — R1011 Right upper quadrant pain: Secondary | ICD-10-CM | POA: Diagnosis present

## 2023-09-08 DIAGNOSIS — K8020 Calculus of gallbladder without cholecystitis without obstruction: Secondary | ICD-10-CM | POA: Diagnosis not present

## 2023-09-08 DIAGNOSIS — K5732 Diverticulitis of large intestine without perforation or abscess without bleeding: Secondary | ICD-10-CM | POA: Diagnosis not present

## 2023-09-08 DIAGNOSIS — K5792 Diverticulitis of intestine, part unspecified, without perforation or abscess without bleeding: Secondary | ICD-10-CM

## 2023-09-08 LAB — I-STAT CHEM 8, ED
BUN: 11 mg/dL (ref 6–20)
Calcium, Ion: 1.27 mmol/L (ref 1.15–1.40)
Chloride: 104 mmol/L (ref 98–111)
Creatinine, Ser: 0.7 mg/dL (ref 0.44–1.00)
Glucose, Bld: 105 mg/dL — ABNORMAL HIGH (ref 70–99)
HCT: 43 % (ref 36.0–46.0)
Hemoglobin: 14.6 g/dL (ref 12.0–15.0)
Potassium: 3.5 mmol/L (ref 3.5–5.1)
Sodium: 135 mmol/L (ref 135–145)
TCO2: 23 mmol/L (ref 22–32)

## 2023-09-08 LAB — COMPREHENSIVE METABOLIC PANEL
ALT: 34 U/L (ref 0–44)
AST: 21 U/L (ref 15–41)
Albumin: 4.4 g/dL (ref 3.5–5.0)
Alkaline Phosphatase: 71 U/L (ref 38–126)
Anion gap: 9 (ref 5–15)
BUN: 12 mg/dL (ref 6–20)
CO2: 25 mmol/L (ref 22–32)
Calcium: 9.7 mg/dL (ref 8.9–10.3)
Chloride: 102 mmol/L (ref 98–111)
Creatinine, Ser: 0.64 mg/dL (ref 0.44–1.00)
GFR, Estimated: >60 mL/min (ref >60)
Glucose, Bld: 103 mg/dL — ABNORMAL HIGH (ref 70–99)
Potassium: 3.9 mmol/L (ref 3.5–5.1)
Sodium: 136 mmol/L (ref 135–145)
Total Bilirubin: 0.6 mg/dL (ref 0.3–1.2)
Total Protein: 8 g/dL (ref 6.5–8.1)

## 2023-09-08 LAB — CBC
HCT: 40.2 % (ref 36.0–46.0)
Hemoglobin: 12.7 g/dL (ref 12.0–15.0)
MCH: 24.5 pg — ABNORMAL LOW (ref 26.0–34.0)
MCHC: 31.6 g/dL (ref 30.0–36.0)
MCV: 77.6 fL — ABNORMAL LOW (ref 80.0–100.0)
Platelets: 349 10*3/uL (ref 150–400)
RBC: 5.18 MIL/uL — ABNORMAL HIGH (ref 3.87–5.11)
RDW: 13.2 % (ref 11.5–15.5)
WBC: 8 10*3/uL (ref 4.0–10.5)
nRBC: 0 % (ref 0.0–0.2)

## 2023-09-08 LAB — LIPASE, BLOOD: Lipase: 21 U/L (ref 11–51)

## 2023-09-08 MED ORDER — KETOROLAC TROMETHAMINE 15 MG/ML IJ SOLN
15.0000 mg | Freq: Once | INTRAMUSCULAR | Status: DC
  Filled 2023-09-08: qty 1

## 2023-09-08 MED ORDER — ONDANSETRON 4 MG PO TBDP: 4.0000 mg | ORAL_TABLET | Freq: Three times a day (TID) | ORAL | 0 refills | Status: DC | PRN

## 2023-09-08 MED ORDER — AMOXICILLIN-POT CLAVULANATE 875-125 MG PO TABS: 1.0000 | ORAL_TABLET | Freq: Two times a day (BID) | ORAL | 0 refills | Status: DC

## 2023-09-08 MED ORDER — IOHEXOL 300 MG/ML  SOLN
100.0000 mL | Freq: Once | INTRAMUSCULAR | Status: AC | PRN
  Administered 2023-09-08: 100 mL via INTRAVENOUS

## 2023-09-08 NOTE — Discharge Instructions (Addendum)
As discussed, CT scan did show signs of diverticulitis which I think is causing her symptoms.  Will put on antibiotics for treatment of said condition.  Will recommend liquid diet.  For the next few days and slowly adding foods back into your regular diet.  Recommend follow-up with primary care for reassessment of your symptoms.  Please do not hesitate to return to emergency department for worrisome signs and symptoms we discussed become apparent.

## 2023-09-08 NOTE — ED Triage Notes (Signed)
Pt reports persistent lower back pain that started after sexual intercourse , 2 days ago .  Was seen at [facility] where se got blood testing and ultrasound yet did not wait for results . due to long wait . Denies NV .

## 2023-09-08 NOTE — ED Provider Notes (Signed)
EMERGENCY DEPARTMENT AT [facility] Provider Note   CSN: [id number] Arrival date & time: 09/08/23  [id number]     History  Chief Complaint  Patient presents with   Abdominal Pain    Misty Kelly is a 32 y.o. female.   Abdominal Pain   32 year old female presents emergency department with complaints of abdominal pain.  Patient states that she has had abdominal pain since sexual intercourse on Sunday.  States initially noted pain in the pelvic region.  States that now pain is radiating to her upper middle abdomen as well as right upper abdomen.  Was seen in triage area at [facility] on 09/06/2023 where she had pelvic ultrasound performed which was negative.  She left prior to being seen by provider.  States that pain has continued.  Denies any fever, nausea, vomiting, urinary symptoms, vaginal symptoms, change in bowel habits.  Last bowel movement this morning and regular per patient.  Reports intermittent worsening of symptoms without known exacerbating or relieving factors.  Describes pain as sharp and last a matter of seconds before it resolves.  Patient did have a D&C performed on 08/10/2023.  Past medical history significant for anemia, diabetes mellitus type 2  Home Medications Prior to Admission medications   Medication Sig Start Date End Date Taking? Authorizing Provider  amoxicillin-clavulanate (AUGMENTIN) 875-125 MG tablet Take 1 tablet by mouth every 12 (twelve) hours. 09/08/23  Yes Sherian Maroon A, PA  ondansetron (ZOFRAN-ODT) 4 MG disintegrating tablet Take 1 tablet (4 mg total) by mouth every 8 (eight) hours as needed. 09/08/23  Yes Sherian Maroon A, PA  Ferrous Fumarate (HEMOCYTE - 106 MG FE) 324 (106 Fe) MG TABS tablet Take 1 tablet (106 mg of iron total) by mouth 2 (two) times daily. Patient not taking: Reported on 10/07/2018 07/16/18 03/21/20  Arabella Merles, CNM      Allergies    Patient has no known allergies.    Review of Systems   Review of Systems   Gastrointestinal:  Positive for abdominal pain.  All other systems reviewed and are negative.   Physical Exam Updated Vital Signs BP 121/80   Pulse 79   Temp 98.7 F (37.1 C) (Oral)   Resp 16   Wt 82 kg   LMP 04/05/2023 (Approximate) Comment: per provider Misty Kelly, hcg is negative 09/08/2023  SpO2 99%   BMI 30.08 kg/m  Physical Exam Vitals and nursing note reviewed.  Constitutional:      General: She is not in acute distress.    Appearance: She is well-developed.  HENT:     Head: Normocephalic and atraumatic.  Eyes:     Conjunctiva/sclera: Conjunctivae normal.  Cardiovascular:     Rate and Rhythm: Normal rate and regular rhythm.     Heart sounds: No murmur heard. Pulmonary:     Effort: Pulmonary effort is normal. No respiratory distress.     Breath sounds: Normal breath sounds.  Abdominal:     Palpations: Abdomen is soft.     Tenderness: There is abdominal tenderness in the right lower quadrant and left lower quadrant. There is no right CVA tenderness, left CVA tenderness or guarding. Negative signs include Murphy's sign.  Musculoskeletal:        General: No swelling.     Cervical back: Neck supple.  Skin:    General: Skin is warm and dry.     Capillary Refill: Capillary refill takes less than 2 seconds.  Neurological:     Mental Status: She  is alert.  Psychiatric:        Mood and Affect: Mood normal.     ED Results / Procedures / Treatments   Labs (all labs ordered are listed, but only abnormal results are displayed) Labs Reviewed  COMPREHENSIVE METABOLIC PANEL - Abnormal; Notable for the following components:      Result Value   Glucose, Bld 103 (*)    All other components within normal limits  CBC - Abnormal; Notable for the following components:   RBC 5.18 (*)    MCV 77.6 (*)    MCH 24.5 (*)    All other components within normal limits  I-STAT CHEM 8, ED - Abnormal; Notable for the following components:   Glucose, Bld 105 (*)    All other components  within normal limits  LIPASE, BLOOD    EKG None  Radiology CT ABDOMEN PELVIS W CONTRAST  Result Date: 09/08/2023 CLINICAL DATA:  Abdominal pain, acute, nonlocalized EXAM: CT ABDOMEN AND PELVIS WITH CONTRAST TECHNIQUE: Multidetector CT imaging of the abdomen and pelvis was performed using the standard protocol following bolus administration of intravenous contrast. RADIATION DOSE REDUCTION: This exam was performed according to the departmental dose-optimization program which includes automated exposure control, adjustment of the mA and/or kV according to patient size and/or use of iterative reconstruction technique. CONTRAST:  OMNIPAQUE IOHEXOL 300 MG/ML  SOLN COMPARISON:  Ultrasound pelvis from 09/06/2023. FINDINGS: Lower chest: The lung bases are clear. No pleural effusion. The heart is normal in size. No pericardial effusion. Hepatobiliary: The liver is normal in size. Non-cirrhotic configuration. No suspicious mass. These is mild diffuse hepatic steatosis. No intrahepatic or extrahepatic bile duct dilation. Partially contracted gallbladder pedal small-to-moderate volume partially calcified gallstones/sludge without imaging signs of acute cholecystitis. Normal gallbladder wall thickness. No pericholecystic inflammatory changes. Pancreas: Unremarkable. No pancreatic ductal dilatation or surrounding inflammatory changes. Spleen: Within normal limits. No focal lesion. Adrenals/Urinary Tract: Adrenal glands are unremarkable. No suspicious renal mass. No hydronephrosis. No renal or ureteric calculi. Unremarkable urinary bladder. Stomach/Bowel: No disproportionate dilation of the small or large bowel loops. The appendix is unremarkable. There is a small diverticulum along the mesenteric side of the distal descending colon (series 301, image 49), which exhibits thick walls and surrounding fat stranding, compatible with acute uncomplicated diverticulitis. No walled off abscess or loculated collection. No  pneumoperitoneum. No disproportionate dilation of small or large bowel loops to suggest bowel obstruction. Vascular/Lymphatic: No ascites or pneumoperitoneum. No abdominal or pelvic lymphadenopathy, by size criteria. No aneurysmal dilation of the major abdominal arteries. Reproductive: Normal-size anteverted uterus. There is a hypoattenuating 2.5 x 2.7 cm structure in the right adnexa with peripheral hyperattenuating wall, this is incompletely characterized on the current exam but concerning for hemorrhagic corpus luteal cyst. Unremarkable left ovary. Other: There is a tiny fat containing umbilical hernia. The soft tissues and abdominal wall are otherwise unremarkable. Musculoskeletal: No suspicious osseous lesions. IMPRESSION: 1. Acute uncomplicated diverticulitis of the distal descending colon. No walled off abscess or loculated collection. No pneumoperitoneum. 2. There is a 2.7 cm hypoattenuating structure in the right adnexa with peripheral hyperattenuating wall, concerning for hemorrhagic corpus luteal cyst. 3. Cholelithiasis without imaging signs of acute cholecystitis. 4. Multiple other nonacute observations, as described above. Electronically Signed   By: Jules Schick M.D.   On: 09/08/2023 11:56   US Pelvis Complete  Result Date: 09/06/2023 CLINICAL DATA:  Severe pelvic pain. EXAM: TRANSABDOMINAL ULTRASOUND OF PELVIS TECHNIQUE: Transabdominal ultrasound examination of the pelvis was performed including evaluation  of the uterus, ovaries, adnexal regions, and pelvic cul-de-sac. COMPARISON:  08/07/2023. FINDINGS: Uterus Measurements: 8.7 x 4.4 x 6.6 cm = volume: 133 mL. No fibroids or other mass visualized. Endometrium Thickness: 8 mm, within normal limits. No focal abnormality visualized. Right ovary Measurements: 3.0 x 2.1 x 2.1 cm = volume: 7.0 mL. Normal appearance/no adnexal mass. Left ovary Measurements: 3.0 x 2.1 x 2.3 cm = volume: 8.0 mL. Normal appearance/no adnexal mass. Other findings:  No  abnormal free fluid. IMPRESSION: Normal exam. Electronically Signed   By: Leanna Battles M.D.   On: 09/06/2023 18:04    Procedures Procedures    Medications Ordered in ED Medications  ketorolac (TORADOL) 15 MG/ML injection 15 mg (15 mg Intravenous Patient Refused/Not Given 09/08/23 0956)  iohexol (OMNIPAQUE) 300 MG/ML solution 100 mL (100 mLs Intravenous Contrast Given 09/08/23 1056)    ED Course/ Medical Decision Making/ A&P                                 Medical Decision Making Amount and/or Complexity of Data Reviewed Labs: ordered. Radiology: ordered.  Risk Prescription drug management.   This patient presents to the ED for concern of abdominal pain, this involves an extensive number of treatment options, and is a complaint that carries with it a high risk of complications and morbidity.  The differential diagnosis includes gastritis, PUD, CT pathology, cholecystitis, SBO/LBO, volvulus, diverticulitis, appendicitis, pyelonephritis, cystitis, ectopic pregnancy, ovarian torsion, pancreatitis, other   Co morbidities that complicate the patient evaluation  See HPI   Additional history obtained:  Additional history obtained from EMR External records from outside source obtained and reviewed including diverticulitis   Lab Tests:  I Ordered, and personally interpreted labs.  The pertinent results include: No leukocytosis.  No evidence of anemia.  Placed within range.  No electrolyte abnormalities.  No transaminitis.  No renal dysfunction.  Lipase within normal limits.   Imaging Studies ordered:  I ordered imaging studies including CT abdomen pelvis I independently visualized and interpreted imaging which showed acute uncomplicated diverticulitis.  2.7 cm attenuating structure.  Adnexa.  Cholelithiasis. I agree with the radiologist interpretation  Cardiac Monitoring: / EKG:  The patient was maintained on a cardiac monitor.  I personally viewed and interpreted the  cardiac monitored which showed an underlying rhythm of: Sinus rhythm   Consultations Obtained:  N/a   Problem List / ED Course / Critical interventions / Medication management  Abdominal pain I ordered medication including Toradol   Reevaluation of the patient after these medicines showed that the patient improved I have reviewed the patients home medicines and have made adjustments as needed   Social Determinants of Health:  Denies tobacco, illicit drug use   Test / Admission - Considered:  Abdominal pain Vitals signs within normal range and stable throughout visit. Laboratory/imaging studies significant for: See above 32 year old female presents emergency department with complaints of abdominal pain since Sunday.  On exam, patient with abdominal tenderness in left lower quadrant as well as right lower quadrant.  Patient recently seen 2 days ago in the ED with negative pelvic ultrasound at that time.  CT imaging consistent with diverticulitis of sigmoid colon as well as right-sided ovarian cyst which are most likely causing patient's symptoms.  Will place patient on empiric antibiotics as well as recommend dietary changes.  Follow-up with OB/GYN recommended for reevaluation of ovarian cyst.  Labs reassuring.  Treatment plan discussed with  patient and she acknowledged understanding was agreeable to said plan.  Patient overall well-appearing, afebrile in no acute distress. Worrisome signs and symptoms were discussed with the patient, and the patient acknowledged understanding to return to the ED if noticed. Patient was stable upon discharge.          Final Clinical Impression(s) / ED Diagnoses Final diagnoses:  Diverticulitis  Right ovarian cyst  Calculus of gallbladder without cholecystitis without obstruction    Rx / DC Orders ED Discharge Orders          Ordered    amoxicillin-clavulanate (AUGMENTIN) 875-125 MG tablet  Every 12 hours        09/08/23 1212     ondansetron (ZOFRAN-ODT) 4 MG disintegrating tablet  Every 8 hours PRN        09/08/23 1213              Peter Garter, [place] 09/08/23 1556    Maia Plan, MD 09/10/23 [phone]

## 2023-09-15 ENCOUNTER — Other Ambulatory Visit: Payer: Self-pay

## 2023-09-16 ENCOUNTER — Ambulatory Visit: Payer: Medicaid Other

## 2023-09-16 DIAGNOSIS — O020 Blighted ovum and nonhydatidiform mole: Secondary | ICD-10-CM

## 2023-09-16 DIAGNOSIS — Z3A01 Less than 8 weeks gestation of pregnancy: Secondary | ICD-10-CM

## 2023-09-16 NOTE — Progress Notes (Signed)
Patient presents for repeat HCG. Patient was sent to the lab to have labs drawn. Jazmeen Axtell l Teghan Philbin, CMA

## 2023-09-17 LAB — BETA HCG QUANT (REF LAB): hCG Quant: 1 m[IU]/mL

## 2023-10-05 ENCOUNTER — Other Ambulatory Visit (INDEPENDENT_AMBULATORY_CARE_PROVIDER_SITE_OTHER): Payer: Medicaid Other

## 2023-10-05 DIAGNOSIS — Z3A01 Less than 8 weeks gestation of pregnancy: Secondary | ICD-10-CM

## 2023-10-05 DIAGNOSIS — O020 Blighted ovum and nonhydatidiform mole: Secondary | ICD-10-CM

## 2023-10-05 NOTE — Progress Notes (Signed)
Patient presents for repeat HCG. Patient was sent to the lab to have labs drawn. [name] l [name], CMA

## 2023-10-06 LAB — BETA HCG QUANT (REF LAB): hCG Quant: <1 m[IU]/mL

## 2024-05-22 NOTE — Progress Notes (Signed)
 Subjective   Patient ID:  Misty Kelly is a 33 y.o. (DOB 23-Apr-1991) female.   Chief Complaint  Patient presents with  . Rectal Bleeding    Friday night patient noticed some red blood when she had a bowel movement, she saw blood in the toliet and on tissue, no pain, patient has not had any other bloody stools since then. Patient did have pork blood for dinner Friday night, she had some diarrhea on Saturday but normal stools on Sunday and today.      HPI  Pt presents c/o blood in her stool on Friday night when she had a BM. Bright red blood in the toilet and on the tissue. Denies any rectal pain at that time. Stool was very dark because she had eaten pork blood (traditional dish in her culture). She had some diarrhea on Saturday, but no blood. Sunday and today, normal BM's - solid and without blood. Denies any abd pain. No fever/chills. Was told in October that she had diverticulitis at an ER visit. She thinks that she took the abx that they gave her. No known FHx of colon CA or any IBD, but her father also has diverticulitis. Has been told in the past that she has hemorrhoids as well.  Problem List, Past Medical History, Past Surgical History, Past Family History, Social History, Medications, and Allergies, were reviewed and updated as appropriate.   Review of Systems Review of Systems  Constitutional:  Negative for chills, fatigue and fever.  Respiratory:  Negative for cough, chest tightness and shortness of breath.   Cardiovascular:  Negative for chest pain, palpitations and leg swelling.  Gastrointestinal:  Positive for blood in stool (with one BM.). Negative for abdominal pain, constipation, diarrhea, nausea and vomiting.  Skin:  Negative for rash.  Neurological:  Negative for dizziness, syncope, weakness, light-headedness and headaches.     Objective   BP 120/84 (BP Location: Left Upper Arm, Patient Position: Sitting)   Pulse 75   Temp 99.4 F (37.4 C) (Oral)   Resp  18   Ht 5' 5 (1.651 m)   Wt 185 lb 12.8 oz (84.3 kg)   LMP 04/08/2024 (Exact Date)   SpO2 99%   Breastfeeding No   BMI 30.92 kg/m   Physical Exam Vitals and nursing note reviewed.  Constitutional:      General: She is not in acute distress.    Appearance: Normal appearance. She is well-developed. She is not ill-appearing or toxic-appearing.  HENT:     Head: Normocephalic and atraumatic.     Mouth/Throat:     Lips: Pink.     Mouth: Mucous membranes are moist.     Pharynx: Oropharynx is clear.  Cardiovascular:     Rate and Rhythm: Normal rate and regular rhythm.     Heart sounds: Normal heart sounds, S1 normal and S2 normal. No murmur heard.    No friction rub. No gallop.  Musculoskeletal:     Cervical back: Neck supple.  Pulmonary:     Effort: Pulmonary effort is normal.     Breath sounds: Normal breath sounds. No decreased breath sounds, wheezing, rhonchi or rales.  Skin:    General: Skin is warm and dry.     Findings: No rash.  Neurological:     General: No focal deficit present.     Mental Status: She is alert and oriented to person, place, and time.  Psychiatric:        Mood and Affect: Mood normal.  Behavior: Behavior normal.        Thought Content: Thought content normal.        Judgment: Judgment normal.     --------------------------- Labs last 12 hours No results found for this or any previous visit (from the past 12 hours).    Impression   1. Blood in the stool         Plan  Reassurance. Discussed with pt that I'm not sure if the pork blood that she had eaten several hours prior to the blood in her stool may have contributed to this. Discussed other possible causes like a fissure or her hemorrhoid. It is very reassuring that it only happened once and that she has had normal BM's since then. Encourage her to increase fluids and fiber in the diet. If she has further blood in her stool, she is aware to let me know and would recommend referral  to GI.  See encounter after visit summary for patient specific instructions.  I have reviewed the information contained in this note and personally verified its accuracy.  I obtained the history of present illness and personally performed the physical exam.  Patient verbalized to me that they understood what their problem is, what they need to do about it and why it is important that they do it.   Follow up in about 2 months (around 07/22/2024) for CPE - fasting..    No orders of the defined types were placed in this encounter.    Patient's Medications       * Accurate as of May 22, 2024  3:15 PM. Reflects encounter med changes as of last refresh          Continued Medications      Instructions  ALLEGRA-D ALLERGY & CONGESTION 60-120 MG per tablet Generic drug: fexofenadine-pseudoephedrine  Take by mouth.       Discontinued Medications    naproxen 500 mg tablet Commonly known as: NAPROSYN Stopped by: Tylene Meres        Risks, benefits, and alternatives of the medications and treatment plan prescribed today were discussed, and patient expressed understanding. Plan follow-up as discussed or as needed if any worsening symptoms or change in condition.    A yearly health maintenance exam was recommended where appropriate.     *Some images could not be shown.

## 2024-07-16 ENCOUNTER — Inpatient Hospital Stay (HOSPITAL_COMMUNITY)

## 2024-07-16 ENCOUNTER — Inpatient Hospital Stay (HOSPITAL_COMMUNITY)
Admission: AD | Admit: 2024-07-16 | Discharge: 2024-07-17 | Disposition: A | Discharge status: Home / Self Care | Attending: Obstetrics and Gynecology | Admitting: Obstetrics and Gynecology

## 2024-07-16 ENCOUNTER — Encounter (HOSPITAL_COMMUNITY): Payer: Self-pay | Admitting: Obstetrics and Gynecology

## 2024-07-16 DIAGNOSIS — N939 Abnormal uterine and vaginal bleeding, unspecified: Secondary | ICD-10-CM | POA: Diagnosis not present

## 2024-07-16 DIAGNOSIS — Z3A01 Less than 8 weeks gestation of pregnancy: Secondary | ICD-10-CM

## 2024-07-16 DIAGNOSIS — O00201 Right ovarian pregnancy without intrauterine pregnancy: Secondary | ICD-10-CM

## 2024-07-16 LAB — COMPREHENSIVE METABOLIC PANEL WITH GFR
ALT: 27 U/L (ref 0–44)
AST: 20 U/L (ref 15–41)
Albumin: 3.9 g/dL (ref 3.5–5.0)
Alkaline Phosphatase: 62 U/L (ref 38–126)
Anion gap: 12 (ref 5–15)
BUN: 10 mg/dL (ref 6–20)
CO2: 23 mmol/L (ref 22–32)
Calcium: 9.2 mg/dL (ref 8.9–10.3)
Chloride: 104 mmol/L (ref 98–111)
Creatinine, Ser: 0.7 mg/dL (ref 0.44–1.00)
GFR, Estimated: >60 mL/min (ref >60)
Glucose, Bld: 97 mg/dL (ref 70–99)
Potassium: 3.8 mmol/L (ref 3.5–5.1)
Sodium: 139 mmol/L (ref 135–145)
Total Bilirubin: 0.5 mg/dL (ref 0.0–1.2)
Total Protein: 7.3 g/dL (ref 6.5–8.1)

## 2024-07-16 LAB — URINALYSIS, ROUTINE W REFLEX MICROSCOPIC
Bilirubin Urine: NEGATIVE
Glucose, UA: NEGATIVE mg/dL
Ketones, ur: NEGATIVE mg/dL
Leukocytes,Ua: NEGATIVE
Nitrite: NEGATIVE
Protein, ur: NEGATIVE mg/dL
Specific Gravity, Urine: 1.024 (ref 1.005–1.030)
pH: 5 (ref 5.0–8.0)

## 2024-07-16 LAB — CBC
HCT: 38.7 % (ref 36.0–46.0)
Hemoglobin: 12.2 g/dL (ref 12.0–15.0)
MCH: 24.6 pg — ABNORMAL LOW (ref 26.0–34.0)
MCHC: 31.5 g/dL (ref 30.0–36.0)
MCV: 78.2 fL — ABNORMAL LOW (ref 80.0–100.0)
Platelets: 351 K/uL (ref 150–400)
RBC: 4.95 MIL/uL (ref 3.87–5.11)
RDW: 13 % (ref 11.5–15.5)
WBC: 10 K/uL (ref 4.0–10.5)
nRBC: 0 % (ref 0.0–0.2)

## 2024-07-16 LAB — HCG, QUANTITATIVE, PREGNANCY: hCG, Beta Chain, Quant, S: 1541 m[IU]/mL — ABNORMAL HIGH (ref <5)

## 2024-07-16 LAB — POCT PREGNANCY, URINE: Preg Test, Ur: POSITIVE — AB

## 2024-07-16 LAB — WET PREP, GENITAL
Clue Cells Wet Prep HPF POC: NONE SEEN
Sperm: NONE SEEN
Trich, Wet Prep: NONE SEEN
WBC, Wet Prep HPF POC: >=10 — AB (ref <10)
Yeast Wet Prep HPF POC: NONE SEEN

## 2024-07-16 NOTE — Discharge Instructions (Signed)
  The risks of methotrexate  were reviewed including failure requiring repeat dosing or eventual surgery. She understands that methotrexate  involves frequent return visits to monitor lab values and that she remains at risk of ectopic rupture until her beta is less than assay. ?The patient opts to proceed with methotrexate .  She has no history of hepatic or renal dysfunction, has normal BUN/Cr/LFT's/platelets.  She is felt to be reliable for follow-up. Side effects of photosensitivity & GI upset were discussed.  She knows to avoid direct sunlight and abstain from alcohol, NSAIDs and sexual intercourse for two weeks. She was counseled to discontinue any MVI with folic acid. ?She understands to follow up on D4 (07/20/24) and D7 (07/23/24) for repeat BHCG and was given the instruction sheet. ?Strict ectopic precautions were reviewed, the patient knows to call with any abdominal pain, vomiting, fainting, or any concerns with her health.  Day 0/1 Day 4 Day 7  Sunday Wednesday Saturday  Monday Thursday Sunday  Tuesday Friday Monday  Wednesday Saturday Tuesday  Thursday Sunday Wednesday  Friday Monday Thursday  Saturday Tuesday Friday

## 2024-07-16 NOTE — MAU Provider Note (Signed)
 S Ms. Torry Adamczak is a 33 y.o. [phone] patient who presents to MAU today with complaint of 6 starting to have light vaginal bleeding when she went to the bathroom today and after wiping.  She reports she is os so had some lower abdominal cramping over the last 2 days.  Denies any active red vaginal bleeding or severe abdominal pain.  Her LMP was 05/27/2024 which would make her 7 weeks 1 day by LMP.  She had a home positive pregnancy test on 07/04/2024.    O BP (!) 153/99 (BP Location: Right Arm)   Pulse 85   Temp 99.5 F (37.5 C) (Oral)   Resp 20   Ht 5' 5 (1.651 m)   Wt 86.4 kg   LMP 05/27/2024 Comment: per provider cooper, hcg is negative 09/08/2023  SpO2 100%   BMI 31.68 kg/m  Physical Exam Vitals and nursing note reviewed.  Constitutional:      General: She is not in acute distress.    Appearance: Normal appearance. She is obese. She is not ill-appearing.  HENT:     Head: Normocephalic.     Nose: Nose normal.     Mouth/Throat:     Mouth: Mucous membranes are moist.  Cardiovascular:     Rate and Rhythm: Normal rate.  Pulmonary:     Effort: Pulmonary effort is normal.  Abdominal:     General: There is no distension.     Palpations: Abdomen is soft.     Tenderness: There is no abdominal tenderness. There is no guarding or rebound.  Musculoskeletal:        General: Normal range of motion.     Cervical back: Normal range of motion.  Skin:    General: Skin is warm.  Neurological:     Mental Status: She is alert and oriented to person, place, and time.  Psychiatric:        Mood and Affect: Mood normal.        Behavior: Behavior normal.     MDM  HIGH  Vaginal bleeding/ cramping in early pregnacy CBC: unremarkable HCG Quant:1,541 ABO: B Positive OB Ultrasound: (suspicious for a right adnexal ectopic pregnancy with trace free fluid in the pelvis which does not appear to be ruptured at this time). Discussed findings with Dr Abigail - Will plan to offer methotrexate   protocol for patient- Patient agreeable to methotrexate  after S/R/B discussed with the patient in detail.   Swabs : Negative, GC pending UA: Moderate blood CMP unremarkable  Differential diagnosis considered for 1st trimester vaginal bleeding includes but is not limited to: ectopic pregnancy, complete spontaneous abortion, incomplete abortion, missed abortion, threatened abortion, embryonic/fetal demise, cervical insufficiency, cervical or vaginal disorder     Orders Placed This Encounter  Procedures   Wet prep, genital    Standing Status:   Standing    Number of Occurrences:   1   US  OB LESS THAN 14 WEEKS WITH OB TRANSVAGINAL    Standing Status:   Standing    Number of Occurrences:   1    Symptom/Reason for Exam:   Vaginal bleeding [[id number]]   Urinalysis, Routine w reflex microscopic -Urine, Clean Catch    Standing Status:   Standing    Number of Occurrences:   1    Specimen Source:   Urine, Clean Catch [76]   CBC    Standing Status:   Standing    Number of Occurrences:   1   hCG, quantitative, pregnancy  Standing Status:   Standing    Number of Occurrences:   1   Comprehensive metabolic panel    Standing Status:   Standing    Number of Occurrences:   1   Pregnancy, urine POC    Standing Status:   Standing    Number of Occurrences:   1   Discharge patient Discharge disposition: 01-Home or Self Care; Discharge patient date: 07/16/2024    Standing Status:   Standing    Number of Occurrences:   1    Discharge disposition:   01-Home or Self Care [1]    Discharge patient date:   07/16/2024      Results for orders placed or performed during the hospital encounter of 07/16/24 (from the past 24 hours)  Pregnancy, urine POC     Status: Abnormal   Collection Time: 07/16/24  8:05 PM  Result Value Ref Range   Preg Test, Ur POSITIVE (A) NEGATIVE  Urinalysis, Routine w reflex microscopic -Urine, Clean Catch     Status: Abnormal   Collection Time: 07/16/24  8:08 PM  Result Value Ref  Range   Color, Urine YELLOW YELLOW   APPearance HAZY (A) CLEAR   Specific Gravity, Urine 1.024 1.005 - 1.030   pH 5.0 5.0 - 8.0   Glucose, UA NEGATIVE NEGATIVE mg/dL   Hgb urine dipstick MODERATE (A) NEGATIVE   Bilirubin Urine NEGATIVE NEGATIVE   Ketones, ur NEGATIVE NEGATIVE mg/dL   Protein, ur NEGATIVE NEGATIVE mg/dL   Nitrite NEGATIVE NEGATIVE   Leukocytes,Ua NEGATIVE NEGATIVE   RBC / HPF 0-5 0 - 5 RBC/hpf   WBC, UA 0-5 0 - 5 WBC/hpf   Bacteria, UA RARE (A) NONE SEEN   Squamous Epithelial / HPF 0-5 0 - 5 /HPF   Mucus PRESENT    Hyaline Casts, UA PRESENT   Wet prep, genital     Status: Abnormal   Collection Time: 07/16/24  8:08 PM   Specimen: Urine, Clean Catch  Result Value Ref Range   Yeast Wet Prep HPF POC NONE SEEN NONE SEEN   Trich, Wet Prep NONE SEEN NONE SEEN   Clue Cells Wet Prep HPF POC NONE SEEN NONE SEEN   WBC, Wet Prep HPF POC >=10 (A) <10   Sperm NONE SEEN   CBC     Status: Abnormal   Collection Time: 07/16/24  8:18 PM  Result Value Ref Range   WBC 10.0 4.0 - 10.5 K/uL   RBC 4.95 3.87 - 5.11 MIL/uL   Hemoglobin 12.2 12.0 - 15.0 g/dL   HCT 61.2 63.9 - 53.9 %   MCV 78.2 (L) 80.0 - 100.0 fL   MCH 24.6 (L) 26.0 - 34.0 pg   MCHC 31.5 30.0 - 36.0 g/dL   RDW 86.9 88.4 - 84.4 %   Platelets 351 150 - 400 K/uL   nRBC 0.0 0.0 - 0.2 %  hCG, quantitative, pregnancy     Status: Abnormal   Collection Time: 07/16/24  8:18 PM  Result Value Ref Range   hCG, Beta Chain, Quant, S 1,541 (H) <5 mIU/mL  Comprehensive metabolic panel     Status: None   Collection Time: 07/16/24  8:18 PM  Result Value Ref Range   Sodium 139 135 - 145 mmol/L   Potassium 3.8 3.5 - 5.1 mmol/L   Chloride 104 98 - 111 mmol/L   CO2 23 22 - 32 mmol/L   Glucose, Bld 97 70 - 99 mg/dL   BUN 10 6 - 20  mg/dL   Creatinine, Ser 9.29 0.44 - 1.00 mg/dL   Calcium 9.2 8.9 - 89.6 mg/dL   Total Protein 7.3 6.5 - 8.1 g/dL   Albumin 3.9 3.5 - 5.0 g/dL   AST 20 15 - 41 U/L   ALT 27 0 - 44 U/L   Alkaline  Phosphatase 62 38 - 126 U/L   Total Bilirubin 0.5 0.0 - 1.2 mg/dL   GFR, Estimated >39 >39 mL/min   Anion gap 12 5 - 15     Narrative & Impression  CLINICAL DATA:  Vaginal bleeding   EXAM: OBSTETRIC <14 WK US  AND TRANSVAGINAL OB US    TECHNIQUE: Both transabdominal and transvaginal ultrasound examinations were performed for complete evaluation of the gestation as well as the maternal uterus, adnexal regions, and pelvic cul-de-sac. Transvaginal technique was performed to assess early pregnancy.   COMPARISON:  None Available.   FINDINGS: Intrauterine gestational sac: None   Yolk sac:  None intrauterine   Embryo:  None intrauterine   Subchorionic hemorrhage:  None visualized.   Maternal uterus/adnexae: Left ovary measures 3.1 x 1.9 x 1.3 cm. The right ovary measures 3.6 x 2.1 x 2.1 cm. Separate from the right ovary and within the adnexa, there is in ovoid echogenic soft tissue mass measuring 1.6 x 1.3 x 1.3 cm with suspicion of internal yolk sac. Peripheral vascularity. Small volume free fluid in the cul-de-sac.   IMPRESSION: No IUP identified. 1.6 cm ovoid soft tissue echogenicity in the right adnexa with appearance highly suspicious for a right adnexal ectopic pregnancy. Trace free fluid in the pelvis.   Critical Value/emergent results were called by telephone at the time of interpretation on 07/16/2024 at 10:32 pm to provider OLAM DALTON , who verbally acknowledged these results.     Electronically Signed   By: Luke Bun M.D.   On: 07/16/2024 22:32     The risks of methotrexate  were reviewed including failure requiring repeat dosing or eventual surgery. She understands that methotrexate  involves frequent return visits to monitor lab values and that she remains at risk of ectopic rupture until her beta is less than assay. ?The patient opts to proceed with methotrexate .  She has no history of hepatic or renal dysfunction, has normal BUN/Cr/LFT's/platelets.  She is  felt to be reliable for follow-up. Side effects of photosensitivity & GI upset were discussed.  She knows to avoid direct sunlight and abstain from alcohol, NSAIDs and sexual intercourse for two weeks. She was counseled to discontinue any MVI with folic acid. ?She understands to follow up on D4 (07/20/24) and D7 (07/23/24) for repeat BHCG and was given the instruction sheet. ?Strict ectopic precautions were reviewed, the patient knows to call with any abdominal pain, vomiting, fainting, or any concerns with her health.  Day 0/1 Day 4 Day 7  Sunday Wednesday Saturday  Monday Thursday Sunday  Tuesday Friday Monday  Wednesday Saturday Tuesday  Thursday Sunday Wednesday  Friday Monday Thursday  Saturday Tuesday Friday     Methotrexate  Treatment Protocol for Ectopic Pregnancy  Pretreatment testing and instructions  hCG concentration  Transvaginal ultrasound  Blood group and Rh(D) typing; give Rhogam 300 mcg IM, if indicated  Complete blood count  Liver and renal function tests  Discontinue folic acid supplements  Counseled patient to avoid NSAIDs, recommend acetaminophen  if an analgesic is needed  Advise patient to refrain from sexual intercourse and strenuous exercise  Treatment day  Single dose protocol   1  hCG.  Administer Methotrexate  50 mg/m2 body  surface area IM  4  hCG  7  hCG  If <15 percent hCG decline from day 4 to 7, give additional dose of methotrexate  50 mg/m2 IM  If >=15 percent hCG decline from day 4 to 7, draw hCG weekly until undetectable  14  hCG  If <15 percent hCG decline from day 7 to 14, give additional dose of methotrexate  50 mg/m2 IM  If >=15 percent hCG decline from day 7 to 14, check hCG weekly until undetectable  21 and 28  If 3 doses have been given and there is a <15 percent hCG decline from day 21 to 28, proceed with laparoscopic surgery  Laparoscopy  If severe abdominal pain or an acute abdomen suggestive of tubal rupture occurs If ultrasonography reveals  greater than 300 mL pelvic or other intraperitoneal fluid  I have reviewed the patient chart and performed the physical exam . I have ordered & interpreted the lab results and reviewed and interpreted the ultrasound images and agree with the radiologist findings. I have also discussed the findings with the Ob Attending ( Dr Abigail) Medications ordered as stated below.  A/P as described below.  Counseling and education provided and patient agreeable  with plan as described below. Verbalized understanding.    ASSESSMENT Medical screening exam complete  Right ovarian pregnancy without intrauterine pregnancy  Vaginal spotting    PLAN Future Appointments  Date Time Provider Department Center  07/20/2024 10:20 AM [facility]    Discharge from MAU in stable condition  See AVS for full description of educational information and instructions provided to the patient at time of discharge  Warning signs for worsening condition that would warrant emergency follow-up discussed Patient may return to MAU as needed   Littie Olam LABOR, NP 07/17/2024 1:20 AM

## 2024-07-16 NOTE — MAU Note (Signed)
 MAU Triage Note:  .[name] is a 33 y.o. at Unknown here in MAU reporting: started having light vaginal bleeding when she went to the bathroom. Also reporting lower abdominal cramping. Denies abnormal discharge. Positive HPT on 8/5 or 8/6.   Patient complaint: Bleeding and Cramping  Pain Score: 4  Pain Location: Abdomen     Onset of complaint: today LMP: Patient's last menstrual period was 05/27/2024.  Vitals:   07/16/24 1955  BP: (!) 153/99  Pulse: 85  Resp: 20  Temp: 99.5 F (37.5 C)  SpO2: 100%     Lab orders placed from triage: UPT

## 2024-07-17 LAB — GC/CHLAMYDIA PROBE AMP (~~LOC~~) NOT AT ARMC
Chlamydia: NEGATIVE
Comment: NEGATIVE
Comment: NORMAL
Neisseria Gonorrhea: NEGATIVE

## 2024-07-17 MED ORDER — METHOTREXATE FOR ECTOPIC PREGNANCY
50.0000 mg/m2 | Freq: Once | INTRAMUSCULAR | Status: AC
  Administered 2024-07-17: 100 mg via INTRAMUSCULAR
  Filled 2024-07-17: qty 4

## 2024-07-20 ENCOUNTER — Ambulatory Visit (INDEPENDENT_AMBULATORY_CARE_PROVIDER_SITE_OTHER): Payer: Self-pay

## 2024-07-20 ENCOUNTER — Ambulatory Visit: Payer: Self-pay | Admitting: Obstetrics & Gynecology

## 2024-07-20 ENCOUNTER — Other Ambulatory Visit: Payer: Self-pay

## 2024-07-20 VITALS — BP 130/82 | HR 81 | Ht 65.0 in | Wt 190.0 lb

## 2024-07-20 DIAGNOSIS — O00201 Right ovarian pregnancy without intrauterine pregnancy: Secondary | ICD-10-CM

## 2024-07-20 DIAGNOSIS — Z3A01 Less than 8 weeks gestation of pregnancy: Secondary | ICD-10-CM

## 2024-07-20 LAB — BETA HCG QUANT (REF LAB): hCG Quant: 1445 m[IU]/mL

## 2024-07-20 NOTE — Progress Notes (Signed)
 Patient has received Methotrexate  for ectopic pregnancy, Day 1 HCG was 1541. Day 4 (8/21) HCG is 1445, patient had no concerning symptoms earlier today in office Needs Day 7 HCG in MAU on 8/24, message sent to MAU staff to help in scheduling this lab draw. Ectopic precautions advised.   Gloris Hugger, MD

## 2024-07-20 NOTE — Progress Notes (Signed)
 Pt here today for STAT Beta s/p right ectopic pregnancy and given methotrexate  on 07/17/24 day 4 f/u quant.  Pt denies pain and is having brown spotting when she wipes. Pt advised that will close today at noon and the provider from the hospital will call her with results by end of day today.  Pt verbalized understanding.  Dr. Herchel notified.   Daveyon Kitchings,RN  07/20/24

## 2024-07-21 ENCOUNTER — Encounter: Payer: Self-pay | Admitting: *Deleted

## 2024-07-21 NOTE — Telephone Encounter (Addendum)
-----   Message from Gloris Hugger sent at 07/20/2024  4:50 PM EDT ----- Patient has received Methotrexate  for ectopic pregnancy, [place] 1 HCG was 1541. Jaslynne Dahan 4 (8/21) HCG is 1445, patient had no concerning symptoms earlier today in office Needs [place] 7 HCG in MAU on 8/24, message sent to MAU staff to help in scheduling this lab draw. Ectopic precautions advised.   Gloris Hugger, MD  ----- Message ----- From: Rebecka Memos Lab Results In Sent: 07/20/2024   1:36 PM EDT To: Gloris DELENA Hugger, MD  8/22  1030  Called pt and she did not answer. I left message stating that I am calling in regards to her recent lab test and further information. I stated that I will send a secure message to her Mychart which she should check today.

## 2024-07-23 ENCOUNTER — Encounter: Payer: Self-pay | Admitting: Advanced Practice Midwife

## 2024-07-23 ENCOUNTER — Other Ambulatory Visit: Payer: Self-pay

## 2024-07-23 ENCOUNTER — Telehealth: Payer: Self-pay | Admitting: Advanced Practice Midwife

## 2024-07-23 ENCOUNTER — Inpatient Hospital Stay (HOSPITAL_COMMUNITY)
Admission: AD | Admit: 2024-07-23 | Discharge: 2024-07-23 | Disposition: A | Discharge status: Home / Self Care | Attending: Obstetrics and Gynecology | Admitting: Obstetrics and Gynecology

## 2024-07-23 DIAGNOSIS — O00101 Right tubal pregnancy without intrauterine pregnancy: Secondary | ICD-10-CM | POA: Insufficient documentation

## 2024-07-23 DIAGNOSIS — Z3A00 Weeks of gestation of pregnancy not specified: Secondary | ICD-10-CM | POA: Diagnosis not present

## 2024-07-23 LAB — HCG, QUANTITATIVE, PREGNANCY: hCG, Beta Chain, Quant, S: 1864 m[IU]/mL — ABNORMAL HIGH (ref <5)

## 2024-07-23 NOTE — MAU Provider Note (Signed)
 S: 33 y.o. G4P1011 @Unknown  by LMP presents to MAU for repeat hcg on Day 7 following methotrexate  administration for ectopic pregnancy. She denies abdominal pain or vaginal bleeding today.    Her quant hcg on 07/16/24 was 1541 and ultrasound showed right adnexal mass, c/w ectopic pregnancy.  Repeat hcg on Day 4 was 1445.  HPI  O: BP 127/84 (BP Location: Right Arm)   Pulse 80   Temp 98.9 F (37.2 C)   Resp 16   Wt 85 kg   LMP 05/27/2024 Comment: per provider cooper, hcg is negative 09/08/2023  SpO2 100%   BMI 31.18 kg/m   VS reviewed, nursing note reviewed,  Constitutional: well developed, well nourished, no distress HEENT: normocephalic CV: normal rate Pulm/chest wall: normal effort Abdomen: soft Neuro: alert and oriented x 3 Skin: warm, dry Psych: affect normal  Results for orders placed or performed during the hospital encounter of 07/23/24 (from the past 24 hours)  hCG, quantitative, pregnancy     Status: Abnormal   Collection Time: 07/23/24 12:28 PM  Result Value Ref Range   hCG, Beta Chain, Quant, S 1,864 (H) <5 mIU/mL    --/--/B POS (09/10 0940)  MDM: Quant drawn in MAU but pt had to leave MAU due to death in the family and funeral services but will return tonight if methotrexate  second dose is warranted.  Reviewed results and hcg did not drop, and actually rose by small percentage today  Consult Dr Cleatus with assessment and findings. Second dose of methotrexate  recommended.  Message sent to patient (pt requested MyChart communication) regarding lab results and need to return to MAU tonight.     A:  1. Right tubal pregnancy without intrauterine pregnancy     P: D/C home with ectopic/bleeding precautions Return today/tonight for second dose of methotrexate  Return to MAU as needed for emergencies  LEFTWICH-KIRBY, [name], CNM 2:18 PM

## 2024-07-24 ENCOUNTER — Encounter (HOSPITAL_COMMUNITY): Payer: Self-pay | Admitting: Obstetrics and Gynecology

## 2024-07-24 ENCOUNTER — Inpatient Hospital Stay (HOSPITAL_COMMUNITY)
Admission: AD | Admit: 2024-07-24 | Discharge: 2024-07-24 | Disposition: A | Discharge status: Home / Self Care | Payer: Self-pay | Attending: Obstetrics and Gynecology | Admitting: Obstetrics and Gynecology

## 2024-07-24 DIAGNOSIS — O00101 Right tubal pregnancy without intrauterine pregnancy: Secondary | ICD-10-CM

## 2024-07-24 DIAGNOSIS — Z3A00 Weeks of gestation of pregnancy not specified: Secondary | ICD-10-CM

## 2024-07-24 DIAGNOSIS — O009 Unspecified ectopic pregnancy without intrauterine pregnancy: Secondary | ICD-10-CM | POA: Diagnosis present

## 2024-07-24 LAB — COMPREHENSIVE METABOLIC PANEL WITH GFR
ALT: 26 U/L (ref 0–44)
AST: 17 U/L (ref 15–41)
Albumin: 3.8 g/dL (ref 3.5–5.0)
Alkaline Phosphatase: 61 U/L (ref 38–126)
Anion gap: 7 (ref 5–15)
BUN: 10 mg/dL (ref 6–20)
CO2: 22 mmol/L (ref 22–32)
Calcium: 9.3 mg/dL (ref 8.9–10.3)
Chloride: 106 mmol/L (ref 98–111)
Creatinine, Ser: 0.67 mg/dL (ref 0.44–1.00)
GFR, Estimated: >60 mL/min (ref >60)
Glucose, Bld: 102 mg/dL — ABNORMAL HIGH (ref 70–99)
Potassium: 3.9 mmol/L (ref 3.5–5.1)
Sodium: 135 mmol/L (ref 135–145)
Total Bilirubin: 0.8 mg/dL (ref 0.0–1.2)
Total Protein: 7.9 g/dL (ref 6.5–8.1)

## 2024-07-24 LAB — HCG, QUANTITATIVE, PREGNANCY: hCG, Beta Chain, Quant, S: 1825 m[IU]/mL — ABNORMAL HIGH (ref <5)

## 2024-07-24 LAB — CBC
HCT: 38.6 % (ref 36.0–46.0)
Hemoglobin: 12.4 g/dL (ref 12.0–15.0)
MCH: 24.8 pg — ABNORMAL LOW (ref 26.0–34.0)
MCHC: 32.1 g/dL (ref 30.0–36.0)
MCV: 77 fL — ABNORMAL LOW (ref 80.0–100.0)
Platelets: 332 K/uL (ref 150–400)
RBC: 5.01 MIL/uL (ref 3.87–5.11)
RDW: 13.1 % (ref 11.5–15.5)
WBC: 7.8 K/uL (ref 4.0–10.5)
nRBC: 0 % (ref 0.0–0.2)

## 2024-07-24 MED ORDER — METHOTREXATE FOR ECTOPIC PREGNANCY
50.0000 mg/m2 | Freq: Once | INTRAMUSCULAR | Status: AC
  Administered 2024-07-24: 100 mg via INTRAMUSCULAR
  Filled 2024-07-24: qty 4

## 2024-07-24 NOTE — MAU Note (Signed)
 Placida Cambre is a 33 y.o. at Unknown here in MAU reporting: doing ok.  Denies any pain or bleeding at this time.  States had been having some brown d/c, that stopped a couple days ago.  Returned today for second Methotrexate  injection.  Onset of complaint: ongoing eval Pain score: none Vitals:   07/24/24 1215  BP: 122/73  Pulse: 77  Resp: 17  Temp: 99.2 F (37.3 C)  SpO2: 99%      Lab orders placed from triage:

## 2024-07-24 NOTE — Telephone Encounter (Signed)
 Called patient to notify of lab results. I left message for patient to return to MAU or return phone call.  Hcg level did not drop appropriately on Day 7 following methotrexate . Pt had to leave MAU to go to a family funeral but reported she could return afterwards if a second dose of methotrexate  was needed.

## 2024-07-24 NOTE — Discharge Instructions (Signed)
 The risks of methotrexate  were reviewed including failure requiring repeat dosing or eventual surgery. You expressed understanding that methotrexate  involves frequent return visits to monitor lab values and that you remain at risk of ectopic rupture until your pregnancy hormone level returns to normal. You have opted to proceed with methotrexate .  We discussed side effects of photosensitivity & GI upset.  You should avoid direct sunlight and abstain from alcohol, NSAIDs and sexual intercourse for two weeks. If you are taking any multivitamins with folic acid you should discontinue them.  ?You should follow up on day 4 (07/27/2024) at the [facility] for a hcg blood level. I will send a message to the clinic to schedule this visit and it will appear in your MyChart.   You should follow up on  day 7 (07/30/2024) for repeat pregnancy hormone level checks at the MAU.  ?Call or return to the MAU with any abdominal pain, vomiting, fainting, or fever.

## 2024-07-24 NOTE — MAU Provider Note (Signed)
 History     [id number]  Arrival date and time: 07/24/24 1150    Chief Complaint  Patient presents with   methotrexate  inj     HPI Misty Kelly is a 33 y.o. at Unknown by  with PMHx notable for recently diagnosed ectopic pregnancy, who presents for second dose of methotrexate .   Patient seen in MAU on 07/16/2024 late in evening, diagnosed with adnexal R ectopic  Returned to MAU for day 4 hcg on 07/20/2024, which was 1445 Repeat yesterday for day 7 was 1864 She was advised to come to MAU for repeat dose of methotrexate   On interview today patient reports no abdominal pain Had some brown spotting a few days ago but no vaginal bleeding since then Otherwise feels fine   --/--/B POS (09/10 0940)  OB History     Gravida  5   Para  1   Term  1   Preterm  0   AB  3   Living  1      SAB  2   IAB  0   Ectopic  0   Multiple  0   Live Births  1           Past Medical History:  Diagnosis Date   Allergy    STD (sexually transmitted disease) 01/2014   Tx'd for Chlamydia--did have neg. test of cure   Urinary incontinence     Past Surgical History:  Procedure Laterality Date   BREAST BIOPSY     DILATION AND EVACUATION N/A 08/10/2023   Procedure: DILATATION AND EVACUATION;  Surgeon: Eveline Lynwood MATSU, MD;  Location: [facility] OR;  Service: Gynecology;  Laterality: N/A;   OPERATIVE ULTRASOUND N/A 08/10/2023   Procedure: OPERATIVE ULTRASOUND;  Surgeon: Eveline Lynwood MATSU, MD;  Location: [facility] OR;  Service: Gynecology;  Laterality: N/A;   wisdom teeth ext      Family History  Problem Relation Age of Onset   Hyperlipidemia Brother    Hypertension Brother    Cancer Maternal Grandfather        Lung Ca   Stroke Paternal Grandfather    Cancer Maternal Grandmother 19        Dec Esophogeal Ca   Hypertension Mother     Social History   Socioeconomic History   Marital status: Significant Other    Spouse name: Not on file   Number of children: Not on file   Years of  education: Not on file   Highest education level: Associate degree: academic program  Occupational History   Not on file  Tobacco Use   Smoking status: Never   Smokeless tobacco: Never  Substance and Sexual Activity   Alcohol use: Yes    Alcohol/week: 0.0 standard drinks of alcohol    Comment: only special occasions   Drug use: No   Sexual activity: Yes    Partners: Male    Birth control/protection: None  Other Topics Concern   Not on file  Social History Narrative   Not on file   Social Drivers of Health   Financial Resource Strain: Low Risk  (07/20/2024)   Overall Financial Resource Strain (CARDIA)    Difficulty of Paying Living Expenses: Not very hard  Food Insecurity: No Food Insecurity (07/20/2024)   Hunger Vital Sign    Worried About Running Out of Food in the Last Year: Never true    Ran Out of Food in the Last Year: Never true  Transportation Needs: No Transportation Needs (07/20/2024)  PRAPARE - Administrator, Civil Service (Medical): No    Lack of Transportation (Non-Medical): No  Physical Activity: Insufficiently Active (07/20/2024)   Exercise Vital Sign    Days of Exercise per Week: 4 days    Minutes of Exercise per Session: 20 min  Stress: No Stress Concern Present (07/20/2024)   [employer] of Occupational Health - Occupational Stress Questionnaire    Feeling of Stress: Not at all  Social Connections: Moderately Integrated (07/20/2024)   Social Connection and Isolation Panel    Frequency of Communication with Friends and Family: More than three times a week    Frequency of Social Gatherings with Friends and Family: Twice a week    Attends Religious Services: More than 4 times per year    Active Member of [employer] or Organizations: No    Attends Engineer, structural: Not on file    Marital Status: Living with partner  Intimate Partner Violence: Not At Risk (01/05/2023)   Received from [facility]   HITS    Over the last 12 months how  often did your partner physically hurt you?: Never    Over the last 12 months how often did your partner insult you or talk down to you?: Never    Over the last 12 months how often did your partner threaten you with physical harm?: Never    Over the last 12 months how often did your partner scream or curse at you?: Never    No Known Allergies  No current facility-administered medications on file prior to encounter.   Current Outpatient Medications on File Prior to Encounter  Medication Sig Dispense Refill   ondansetron  (ZOFRAN -ODT) 4 MG disintegrating tablet Take 1 tablet (4 mg total) by mouth every 8 (eight) hours as needed. 20 tablet 0   [DISCONTINUED] Ferrous Fumarate  (HEMOCYTE - 106 MG FE) 324 (106 Fe) MG TABS tablet Take 1 tablet (106 mg of iron total) by mouth 2 (two) times daily. (Patient not taking: Reported on 10/07/2018) 60 tablet 3     ROS Pertinent positives and negative per HPI, all others reviewed and negative  Physical Exam   BP 123/69   Pulse 78   Temp 98.3 F (36.8 C) (Oral)   Resp 16   Ht 5' 5 (1.651 m)   Wt 85.4 kg   LMP 05/27/2024 Comment: per provider cooper, hcg is negative 09/08/2023  SpO2 100%   BMI 31.33 kg/m   Patient Vitals for the past 24 hrs:  BP Temp Temp src Pulse Resp SpO2 Height Weight  07/24/24 1600 123/69 98.3 F (36.8 C) Oral 78 16 100 % -- --  07/24/24 1215 122/73 99.2 F (37.3 C) Oral 77 17 99 % 5' 5 (1.651 m) 85.4 kg    Physical Exam Vitals reviewed.  Constitutional:      General: She is not in acute distress.    Appearance: She is well-developed. She is not diaphoretic.  Eyes:     General: No scleral icterus. Pulmonary:     Effort: Pulmonary effort is normal. No respiratory distress.  Skin:    General: Skin is warm and dry.  Neurological:     Mental Status: She is alert.     Coordination: Coordination normal.      Cervical Exam    Bedside Ultrasound Not performed.  My interpretation: n/a   Labs Results for  orders placed or performed during the hospital encounter of 07/24/24 (from the past 24 hours)  hCG, quantitative,  pregnancy     Status: Abnormal   Collection Time: 07/24/24 12:46 PM  Result Value Ref Range   hCG, Beta Chain, Quant, S 1,825 (H) <5 mIU/mL  Comprehensive metabolic panel with GFR     Status: Abnormal   Collection Time: 07/24/24 12:46 PM  Result Value Ref Range   Sodium 135 135 - 145 mmol/L   Potassium 3.9 3.5 - 5.1 mmol/L   Chloride 106 98 - 111 mmol/L   CO2 22 22 - 32 mmol/L   Glucose, Bld 102 (H) 70 - 99 mg/dL   BUN 10 6 - 20 mg/dL   Creatinine, Ser 9.32 0.44 - 1.00 mg/dL   Calcium 9.3 8.9 - 89.6 mg/dL   Total Protein 7.9 6.5 - 8.1 g/dL   Albumin 3.8 3.5 - 5.0 g/dL   AST 17 15 - 41 U/L   ALT 26 0 - 44 U/L   Alkaline Phosphatase 61 38 - 126 U/L   Total Bilirubin 0.8 0.0 - 1.2 mg/dL   GFR, Estimated >39 >39 mL/min   Anion gap 7 5 - 15  CBC     Status: Abnormal   Collection Time: 07/24/24 12:46 PM  Result Value Ref Range   WBC 7.8 4.0 - 10.5 K/uL   RBC 5.01 3.87 - 5.11 MIL/uL   Hemoglobin 12.4 12.0 - 15.0 g/dL   HCT 61.3 63.9 - 53.9 %   MCV 77.0 (L) 80.0 - 100.0 fL   MCH 24.8 (L) 26.0 - 34.0 pg   MCHC 32.1 30.0 - 36.0 g/dL   RDW 86.8 88.4 - 84.4 %   Platelets 332 150 - 400 K/uL   nRBC 0.0 0.0 - 0.2 %    Imaging No results found.  MAU Course  Procedures Lab Orders         hCG, quantitative, pregnancy         Comprehensive metabolic panel with GFR         CBC    Meds ordered this encounter  Medications   methotrexate  (for ectopic pregnancy) 25 mg/mL chemo injection   Imaging Orders  No imaging studies ordered today    MDM High (Level 5)  Assessment and Plan  #Ectopic pregnancy S/p 1 dose of methotrextae without adequate drop in hcg from day 4>7. Discussed repeat dosing in detail, patient in agreement with second dose.  The risks of methotrexate  were reviewed including failure requiring repeat dosing or eventual surgery. She understands that  methotrexate  involves frequent return visits to monitor lab values and that she remains at risk of ectopic rupture until her beta is less than assay. ?The patient opts to proceed with methotrexate .  She has no history of hepatic or renal dysfunction, has normal BUN/Cr/LFT's/platelets.  She is felt to be reliable for follow-up. Side effects of photosensitivity & GI upset were discussed.  She knows to avoid direct sunlight and abstain from alcohol, NSAIDs and sexual intercourse for two weeks. She was counseled to discontinue any MVI with folic acid. ?She understands to follow up on D4 (07/27/2024) and D7 (07/30/2024) for repeat BHCG and was given the instruction sheet. ?Strict ectopic precautions were reviewed, the patient knows to call with any abdominal pain, vomiting, fainting, or any concerns with her health.  Day 0/1 Day 4 Day 7  Sunday Wednesday Saturday  Monday Thursday Sunday  Tuesday Friday Monday  Wednesday Saturday Tuesday  Thursday Sunday Wednesday  Friday Monday Thursday  Saturday Tuesday Friday    Dispo: discharged to home in stable condition  Misty CHRISTELLA Carolus, MD/MPH 07/24/24 4:06 PM  Allergies as of 07/24/2024   No Known Allergies      Medication List     TAKE these medications    ondansetron  4 MG disintegrating tablet Commonly known as: ZOFRAN -ODT Take 1 tablet (4 mg total) by mouth every 8 (eight) hours as needed.

## 2024-07-27 ENCOUNTER — Other Ambulatory Visit: Payer: Self-pay

## 2024-07-27 ENCOUNTER — Other Ambulatory Visit

## 2024-07-27 DIAGNOSIS — O00101 Right tubal pregnancy without intrauterine pregnancy: Secondary | ICD-10-CM

## 2024-07-28 LAB — BETA HCG QUANT (REF LAB): hCG Quant: 1109 m[IU]/mL

## 2024-08-04 ENCOUNTER — Ambulatory Visit: Payer: Self-pay | Admitting: Obstetrics and Gynecology

## 2024-08-04 ENCOUNTER — Inpatient Hospital Stay (HOSPITAL_COMMUNITY)
Admission: AD | Admit: 2024-08-04 | Discharge: 2024-08-04 | Disposition: A | Discharge status: Home / Self Care | Payer: Self-pay | Attending: Obstetrics and Gynecology | Admitting: Obstetrics and Gynecology

## 2024-08-04 DIAGNOSIS — O009 Unspecified ectopic pregnancy without intrauterine pregnancy: Secondary | ICD-10-CM

## 2024-08-04 DIAGNOSIS — Z3A00 Weeks of gestation of pregnancy not specified: Secondary | ICD-10-CM | POA: Diagnosis not present

## 2024-08-04 DIAGNOSIS — O00101 Right tubal pregnancy without intrauterine pregnancy: Secondary | ICD-10-CM | POA: Insufficient documentation

## 2024-08-04 LAB — HCG, QUANTITATIVE, PREGNANCY: hCG, Beta Chain, Quant, S: 556 m[IU]/mL — ABNORMAL HIGH (ref <5)

## 2024-08-04 NOTE — MAU Provider Note (Signed)
 History     [id number]  Arrival date and time: 08/04/24 1506    Chief Complaint  Patient presents with   Follow-up     HPI Misty Kelly is a 33 y.o. who presents for HCG draw. She has known right ectopic pregnancy in adenexa who is s/p methotrexate  therapy. Denies vaginal bleeding or abdominal pain. Completely asymptomatic.   Methotrexate  doses: Dose 8/18  Dose 2 8/25  Serial HCG's:  8/17  1541   8/21 1445   8/24  1864   8/25  1825    8/28  1109    9/5  556   --/--/B POS (09/10 0940)  Past Medical History:  Diagnosis Date   Allergy    STD (sexually transmitted disease) 01/2014   Tx'd for Chlamydia--did have neg. test of cure   Urinary incontinence     Past Surgical History:  Procedure Laterality Date   BREAST BIOPSY     DILATION AND EVACUATION N/A 08/10/2023   Procedure: DILATATION AND EVACUATION;  Surgeon: Eveline Lynwood MATSU, MD;  Location: [facility] OR;  Service: Gynecology;  Laterality: N/A;   OPERATIVE ULTRASOUND N/A 08/10/2023   Procedure: OPERATIVE ULTRASOUND;  Surgeon: Eveline Lynwood MATSU, MD;  Location: [facility] OR;  Service: Gynecology;  Laterality: N/A;   wisdom teeth ext      Family History  Problem Relation Age of Onset   Hyperlipidemia Brother    Hypertension Brother    Cancer Maternal Grandfather        Lung Ca   Stroke Paternal Grandfather    Cancer Maternal Grandmother 11        Dec Esophogeal Ca   Hypertension Mother     Social History   Socioeconomic History   Marital status: Significant Other    Spouse name: Not on file   Number of children: Not on file   Years of education: Not on file   Highest education level: Associate degree: academic program  Occupational History   Not on file  Tobacco Use   Smoking status: Never   Smokeless tobacco: Never  Substance and Sexual Activity   Alcohol use: Yes    Alcohol/week: 0.0 standard drinks of alcohol    Comment: only special occasions   Drug use: No   Sexual activity: Yes    Partners: Male    Birth  control/protection: None  Other Topics Concern   Not on file  Social History Narrative   Not on file   Social Drivers of Health   Financial Resource Strain: Low Risk  (07/23/2024)   Received from [facility]   Overall Financial Resource Strain (CARDIA)    How hard is it for you to pay for the very basics like food, housing, medical care, and heating?: Not very hard  Food Insecurity: No Food Insecurity (07/23/2024)   Received from [facility]   Hunger Vital Sign    Within the past 12 months, you worried that your food would run out before you got the money to buy more.: Never true    Within the past 12 months, the food you bought just didn't last and you didn't have money to get more.: Never true  Transportation Needs: No Transportation Needs (07/23/2024)   Received from [facility] - Transportation    In the past 12 months, has lack of transportation kept you from medical appointments or from getting medications?: No    In the past 12 months, has lack of transportation kept you from meetings, work, or  from getting things needed for daily living?: No  Physical Activity: Insufficiently Active (07/23/2024)   Received from [facility]   Exercise Vital Sign    On average, how many days per week do you engage in moderate to strenuous exercise (like a brisk walk)?: 3 days    On average, how many minutes do you engage in exercise at this level?: 20 min  Stress: No Stress Concern Present (07/23/2024)   Received from [facility] of Occupational Health - Occupational Stress Questionnaire    Do you feel stress - tense, restless, nervous, or anxious, or unable to sleep at night because your mind is troubled all the time - these days?: Not at all  Social Connections: Socially Integrated (07/23/2024)   Received from [facility]   Social Network    How would you rate your social network (family, work, friends)?: Good participation with social networks  Intimate  Partner Violence: Not At Risk (07/23/2024)   Received from [facility]   HITS    Over the last 12 months how often did your partner physically hurt you?: Never    Over the last 12 months how often did your partner insult you or talk down to you?: Never    Over the last 12 months how often did your partner threaten you with physical harm?: Never    Over the last 12 months how often did your partner scream or curse at you?: Never    No Known Allergies  No current facility-administered medications on file prior to encounter.   Current Outpatient Medications on File Prior to Encounter  Medication Sig Dispense Refill   ondansetron  (ZOFRAN -ODT) 4 MG disintegrating tablet Take 1 tablet (4 mg total) by mouth every 8 (eight) hours as needed. 20 tablet 0   [DISCONTINUED] Ferrous Fumarate  (HEMOCYTE - 106 MG FE) 324 (106 Fe) MG TABS tablet Take 1 tablet (106 mg of iron total) by mouth 2 (two) times daily. (Patient not taking: Reported on 10/07/2018) 60 tablet 3    Pertinent positives and negative per HPI, all others reviewed and negative  Physical Exam   BP 118/81 (BP Location: Right Arm)   Pulse 87   Temp 98.4 F (36.9 C) (Oral)   Resp 16   LMP 05/27/2024 Comment: per provider cooper, hcg is negative 09/08/2023  SpO2 100%   Patient Vitals for the past 24 hrs:  BP Temp Temp src Pulse Resp SpO2  08/04/24 1525 118/81 98.4 F (36.9 C) Oral 87 16 100 %    Physical Exam Vitals and nursing note reviewed.  Constitutional:      Appearance: She is well-developed.  HENT:     Head: Normocephalic and atraumatic.     Mouth/Throat:     Mouth: Mucous membranes are moist.  Eyes:     Extraocular Movements: Extraocular movements intact.  Cardiovascular:     Rate and Rhythm: Normal rate and regular rhythm.  Pulmonary:     Effort: Pulmonary effort is normal.  Abdominal:     Palpations: Abdomen is soft.     Tenderness: There is no abdominal tenderness.  Skin:    Capillary Refill: Capillary  refill takes less than 2 seconds.  Neurological:     General: No focal deficit present.     Mental Status: She is alert.      Labs Results for orders placed or performed during the hospital encounter of 08/04/24 (from the past 24 hours)  hCG, quantitative, pregnancy  Status: Abnormal   Collection Time: 08/04/24  3:19 PM  Result Value Ref Range   hCG, Beta Chain, Quant, S 556 (H) <5 mIU/mL    Imaging No results found.  MAU Course  Procedures Lab Orders         hCG, quantitative, pregnancy    No orders of the defined types were placed in this encounter.  Imaging Orders  No imaging studies ordered today    MDM Moderate (Level 3-4)  Assessment and Plan  #Ectopic Pregnancy, R. Adnexa   Araminta Zorn is a 33 y.o. who presents for HCG draw. She has known right ectopic pregnancy in adenexa who is s/p methotrexate  therapy.  -Denies vaginal bleeding or abdominal pain -See HPI for methotrexate  and HCG timeline -Was suppose to have draw done on Day 7 but states she did not know about the appointment.  -HCG this visit 556 on Day 11 ~50% drop from Day 4 draw.   -Stable for discharge home -Repeat HCG draw on Tuesday 08/08/24. This is closer than a week out but patient does not work on Tuesdays and would like to present to the office so will have initial OP draw on 9/9 and then can follow up weekly in the office.  -Detailed ectopic precautions provided.    Kiyoto Slomski L Kaden Dunkel, MD/MHA 08/04/24 6:32 PM  Allergies as of 08/04/2024   No Known Allergies      Medication List     TAKE these medications    ondansetron  4 MG disintegrating tablet Commonly known as: ZOFRAN -ODT Take 1 tablet (4 mg total) by mouth every 8 (eight) hours as needed.

## 2024-08-04 NOTE — MAU Note (Addendum)
 Misty Kelly is a 33 y.o.  here in MAU reporting: missed her day 7 f/u post 2nd Methotrexate  injection.  States has been doing well. Denies any pain.  Bleeding stopped a few days ago. ( Had one day where she needed a pad, was never like a period).  Onset of complaint: ongoing Pain score: none Vitals:   08/04/24 1525  BP: 118/81  Pulse: 87  Resp: 16  Temp: 98.4 F (36.9 C)  SpO2: 100%     Lab orders placed from triage:  blood work has been drawn.    Pt instructed she does need to wait for results.

## 2024-08-04 NOTE — Telephone Encounter (Deleted)
-----   Message from [place] sent at 08/04/2024 12:18 AM EDT ----- Please call this patient asap and have her come in for a stat hcg.  ----- Message ----- From: Rebecka Memos Lab Results In Sent: 07/28/2024   3:36 AM EDT To: Bebe Furry, MD

## 2024-08-04 NOTE — Telephone Encounter (Addendum)
 RN  called pt to advise her she needs a STAT Beta Hcg drawn per Dr. Izell she missed a day 7 lab draw.  Pt verbalized understanding but she is at work and not able to arrive at office before closing for weekend.  Advised pt to have blood drawn at MAU.  Pt states she will go to MAU today.when she can leave work.   Waddell, RN    ----- Message from Bebe Izell sent at 08/04/2024 12:18 AM EDT ----- Please call this patient asap and have her come in for a stat hcg.  ----- Message ----- From: Rebecka Memos Lab Results In Sent: 07/28/2024   3:36 AM EDT To: Bebe Izell, MD

## 2024-08-07 ENCOUNTER — Other Ambulatory Visit: Payer: Self-pay

## 2024-08-07 DIAGNOSIS — O020 Blighted ovum and nonhydatidiform mole: Secondary | ICD-10-CM

## 2024-08-08 ENCOUNTER — Other Ambulatory Visit

## 2024-08-08 ENCOUNTER — Other Ambulatory Visit: Payer: Self-pay

## 2024-08-08 DIAGNOSIS — O020 Blighted ovum and nonhydatidiform mole: Secondary | ICD-10-CM

## 2024-08-09 ENCOUNTER — Ambulatory Visit: Payer: Self-pay | Admitting: Obstetrics and Gynecology

## 2024-08-09 ENCOUNTER — Other Ambulatory Visit: Payer: Self-pay

## 2024-08-09 DIAGNOSIS — O009 Unspecified ectopic pregnancy without intrauterine pregnancy: Secondary | ICD-10-CM

## 2024-08-09 LAB — BETA HCG QUANT (REF LAB): hCG Quant: 346 m[IU]/mL

## 2024-08-15 ENCOUNTER — Other Ambulatory Visit

## 2024-08-15 DIAGNOSIS — O009 Unspecified ectopic pregnancy without intrauterine pregnancy: Secondary | ICD-10-CM

## 2024-08-16 ENCOUNTER — Ambulatory Visit: Payer: Self-pay

## 2024-08-16 ENCOUNTER — Other Ambulatory Visit: Payer: Self-pay

## 2024-08-16 DIAGNOSIS — O009 Unspecified ectopic pregnancy without intrauterine pregnancy: Secondary | ICD-10-CM

## 2024-08-16 LAB — BETA HCG QUANT (REF LAB): hCG Quant: 175 m[IU]/mL

## 2024-08-16 NOTE — Telephone Encounter (Signed)
 Pt returned call back and I informed her of results.  Pt agreed to appt scheduled on 08/22/24 and 08/29/24 for lab only appt for non stat.   Kien Mirsky,RN  08/16/24

## 2024-08-16 NOTE — Telephone Encounter (Addendum)
-----   Message from Vina Solian sent at 08/16/2024  8:09 AM EDT ----- Regarding: HCG Please let her know that her HCG continues to drop s/p MTX. I would recheck in one week and continue weekly until 0.   Thanks, pad ----- Message ----- From: Interface, [facility] Lab Results In Sent: 08/16/2024   4:37 AM EDT To: Kieth JAYSON Carolin, MD   Left message for pt that I am calling with results and f/u if she could please give the office a call back.  Mekia Dipinto,RN  08/16/24

## 2024-08-16 NOTE — Addendum Note (Signed)
 Addended by: DAUNE [place] HERO on: 08/16/2024 11:36 AM   Modules accepted: Orders

## 2024-08-22 ENCOUNTER — Other Ambulatory Visit: Payer: Self-pay

## 2024-08-22 ENCOUNTER — Other Ambulatory Visit

## 2024-08-22 DIAGNOSIS — O020 Blighted ovum and nonhydatidiform mole: Secondary | ICD-10-CM

## 2024-08-22 DIAGNOSIS — O009 Unspecified ectopic pregnancy without intrauterine pregnancy: Secondary | ICD-10-CM

## 2024-08-23 LAB — BETA HCG QUANT (REF LAB): hCG Quant: 106 m[IU]/mL

## 2024-08-24 ENCOUNTER — Ambulatory Visit: Payer: Self-pay | Admitting: Obstetrics and Gynecology

## 2024-08-28 ENCOUNTER — Other Ambulatory Visit: Payer: Self-pay

## 2024-08-29 ENCOUNTER — Other Ambulatory Visit: Payer: Self-pay

## 2024-08-29 ENCOUNTER — Other Ambulatory Visit

## 2024-08-29 DIAGNOSIS — O009 Unspecified ectopic pregnancy without intrauterine pregnancy: Secondary | ICD-10-CM

## 2024-08-30 ENCOUNTER — Ambulatory Visit: Payer: Self-pay | Admitting: Obstetrics and Gynecology

## 2024-08-30 ENCOUNTER — Other Ambulatory Visit: Payer: Self-pay

## 2024-08-30 DIAGNOSIS — O009 Unspecified ectopic pregnancy without intrauterine pregnancy: Secondary | ICD-10-CM

## 2024-08-30 LAB — BETA HCG QUANT (REF LAB): hCG Quant: 59 m[IU]/mL

## 2024-09-05 ENCOUNTER — Other Ambulatory Visit

## 2024-09-05 DIAGNOSIS — O009 Unspecified ectopic pregnancy without intrauterine pregnancy: Secondary | ICD-10-CM

## 2024-09-06 LAB — BETA HCG QUANT (REF LAB): hCG Quant: 25 m[IU]/mL

## 2024-09-10 ENCOUNTER — Ambulatory Visit: Payer: Self-pay | Admitting: Obstetrics & Gynecology

## 2024-09-11 ENCOUNTER — Telehealth: Payer: Self-pay

## 2024-09-11 DIAGNOSIS — O009 Unspecified ectopic pregnancy without intrauterine pregnancy: Secondary | ICD-10-CM

## 2024-09-11 NOTE — Telephone Encounter (Signed)
 Dr. Cris requested RN call patient and inform need of Beta Hcg and to complete on 09/12/24, order placed. RN informed patient to call and schedule tomorrow. Patient verbalized understanding.  Silvano LELON Piano, RN

## 2024-09-12 ENCOUNTER — Other Ambulatory Visit

## 2024-09-12 ENCOUNTER — Other Ambulatory Visit: Payer: Self-pay

## 2024-09-12 DIAGNOSIS — O009 Unspecified ectopic pregnancy without intrauterine pregnancy: Secondary | ICD-10-CM

## 2024-09-13 ENCOUNTER — Ambulatory Visit: Payer: Self-pay | Admitting: Obstetrics & Gynecology

## 2024-09-13 LAB — BETA HCG QUANT (REF LAB): hCG Quant: 11 m[IU]/mL

## 2024-09-18 ENCOUNTER — Other Ambulatory Visit: Payer: Self-pay | Admitting: *Deleted

## 2024-09-18 DIAGNOSIS — O009 Unspecified ectopic pregnancy without intrauterine pregnancy: Secondary | ICD-10-CM

## 2024-09-19 ENCOUNTER — Other Ambulatory Visit: Payer: Self-pay

## 2024-09-19 ENCOUNTER — Other Ambulatory Visit

## 2024-09-19 DIAGNOSIS — O009 Unspecified ectopic pregnancy without intrauterine pregnancy: Secondary | ICD-10-CM

## 2024-09-20 LAB — BETA HCG QUANT (REF LAB): hCG Quant: 7 m[IU]/mL

## 2024-09-25 ENCOUNTER — Other Ambulatory Visit: Payer: Self-pay | Admitting: *Deleted

## 2024-09-25 DIAGNOSIS — O009 Unspecified ectopic pregnancy without intrauterine pregnancy: Secondary | ICD-10-CM

## 2024-09-26 ENCOUNTER — Other Ambulatory Visit

## 2024-09-26 DIAGNOSIS — O009 Unspecified ectopic pregnancy without intrauterine pregnancy: Secondary | ICD-10-CM

## 2024-09-27 LAB — BETA HCG QUANT (REF LAB): hCG Quant: 7 m[IU]/mL

## 2024-10-01 ENCOUNTER — Ambulatory Visit: Payer: Self-pay | Admitting: Obstetrics & Gynecology

## 2024-10-02 ENCOUNTER — Other Ambulatory Visit: Payer: Self-pay

## 2024-10-02 DIAGNOSIS — O009 Unspecified ectopic pregnancy without intrauterine pregnancy: Secondary | ICD-10-CM

## 2024-10-03 ENCOUNTER — Other Ambulatory Visit

## 2024-10-04 ENCOUNTER — Other Ambulatory Visit: Payer: Self-pay

## 2024-10-04 DIAGNOSIS — O009 Unspecified ectopic pregnancy without intrauterine pregnancy: Secondary | ICD-10-CM

## 2024-10-05 ENCOUNTER — Other Ambulatory Visit: Payer: Self-pay

## 2024-10-05 ENCOUNTER — Other Ambulatory Visit

## 2024-10-05 DIAGNOSIS — O009 Unspecified ectopic pregnancy without intrauterine pregnancy: Secondary | ICD-10-CM

## 2024-10-06 ENCOUNTER — Ambulatory Visit: Payer: Self-pay | Admitting: Family Medicine

## 2024-10-06 DIAGNOSIS — O009 Unspecified ectopic pregnancy without intrauterine pregnancy: Secondary | ICD-10-CM

## 2024-10-06 LAB — BETA HCG QUANT (REF LAB): hCG Quant: 3 m[IU]/mL

## 2024-10-09 NOTE — Telephone Encounter (Addendum)
 Called pt to advise of hcg level from 10/05/24 and provider's recommendation of recheck in one week.  Pt requesting lab visit on 10/17/24 at 1:15.  She had no further questions at this time.    Waddell, RN  ----- Message from Donnice CHRISTELLA Carolus sent at 10/06/2024  8:42 AM EST ----- Downtrending appropriately s/p methotrexate  Clinical staff please coordinate follow up hcg in one week, trend to 0 ----- Message ----- From: Interface, [facility] Lab Results In Sent: 10/06/2024   4:36 AM EST To: Donnice CHRISTELLA Carolus, MD

## 2024-10-17 ENCOUNTER — Other Ambulatory Visit

## 2024-10-17 ENCOUNTER — Other Ambulatory Visit: Payer: Self-pay

## 2024-10-17 DIAGNOSIS — O009 Unspecified ectopic pregnancy without intrauterine pregnancy: Secondary | ICD-10-CM

## 2024-10-18 ENCOUNTER — Ambulatory Visit: Payer: Self-pay | Admitting: Family Medicine

## 2024-10-18 DIAGNOSIS — O00101 Right tubal pregnancy without intrauterine pregnancy: Secondary | ICD-10-CM

## 2024-10-18 LAB — BETA HCG QUANT (REF LAB): hCG Quant: 1 m[IU]/mL

## 2024-10-18 NOTE — Telephone Encounter (Addendum)
-----   Message from Donnice CHRISTELLA Carolus sent at 10/18/2024 10:33 AM EST ----- Downtrending appropriately, so close but would like to trend to undetectable given she received methotrexate  Clinical staff please coordinate follow up hcg in one week, trend to <1 ----- Message ----- From: Interface, [facility] Lab Results In Sent: 10/18/2024   3:36 AM EST To: Donnice CHRISTELLA Carolus, MD  11/19  1325  Called pt and informed her of test result and next steps in care. Pt requested to wait 2 weeks for next hcg level in order to increase chance that the level will then be <1. I stated that I will message Dr. Carolus with her request and will call her back.   1507  Called pt and informed her that Dr. Carolus approved her request. I have scheduled the lab appt on Tuesday 12/2 @ 1:35 pm. She voiced understanding.

## 2024-10-31 ENCOUNTER — Other Ambulatory Visit

## 2024-10-31 DIAGNOSIS — O00101 Right tubal pregnancy without intrauterine pregnancy: Secondary | ICD-10-CM

## 2024-11-01 ENCOUNTER — Ambulatory Visit: Payer: Self-pay | Admitting: Family Medicine

## 2024-11-01 LAB — BETA HCG QUANT (REF LAB): hCG Quant: <1 m[IU]/mL

## 2024-12-05 ENCOUNTER — Inpatient Hospital Stay (HOSPITAL_COMMUNITY)
Admission: AD | Admit: 2024-12-05 | Discharge: 2024-12-05 | Disposition: A | Discharge status: Home / Self Care | Attending: Obstetrics and Gynecology | Admitting: Obstetrics and Gynecology

## 2024-12-05 ENCOUNTER — Encounter (HOSPITAL_COMMUNITY): Payer: Self-pay | Admitting: *Deleted

## 2024-12-05 DIAGNOSIS — R109 Unspecified abdominal pain: Secondary | ICD-10-CM | POA: Diagnosis present

## 2024-12-05 DIAGNOSIS — O3680X0 Pregnancy with inconclusive fetal viability, not applicable or unspecified: Secondary | ICD-10-CM | POA: Diagnosis not present

## 2024-12-05 DIAGNOSIS — Z3A01 Less than 8 weeks gestation of pregnancy: Secondary | ICD-10-CM | POA: Diagnosis not present

## 2024-12-05 DIAGNOSIS — Z3201 Encounter for pregnancy test, result positive: Secondary | ICD-10-CM | POA: Insufficient documentation

## 2024-12-05 DIAGNOSIS — O09291 Supervision of pregnancy with other poor reproductive or obstetric history, first trimester: Secondary | ICD-10-CM | POA: Diagnosis not present

## 2024-12-05 DIAGNOSIS — Z8759 Personal history of other complications of pregnancy, childbirth and the puerperium: Secondary | ICD-10-CM | POA: Diagnosis not present

## 2024-12-05 LAB — URINALYSIS, ROUTINE W REFLEX MICROSCOPIC
Bilirubin Urine: NEGATIVE
Glucose, UA: 50 mg/dL — AB
Hgb urine dipstick: NEGATIVE
Ketones, ur: NEGATIVE mg/dL
Leukocytes,Ua: NEGATIVE
Nitrite: NEGATIVE
Protein, ur: NEGATIVE mg/dL
Specific Gravity, Urine: 1.019 (ref 1.005–1.030)
pH: 6 (ref 5.0–8.0)

## 2024-12-05 LAB — CBC
HCT: 39.2 % (ref 36.0–46.0)
Hemoglobin: 12.8 g/dL (ref 12.0–15.0)
MCH: 24.8 pg — ABNORMAL LOW (ref 26.0–34.0)
MCHC: 32.7 g/dL (ref 30.0–36.0)
MCV: 76 fL — ABNORMAL LOW (ref 80.0–100.0)
Platelets: 360 K/uL (ref 150–400)
RBC: 5.16 MIL/uL — ABNORMAL HIGH (ref 3.87–5.11)
RDW: 12.6 % (ref 11.5–15.5)
WBC: 10.7 K/uL — ABNORMAL HIGH (ref 4.0–10.5)
nRBC: 0 % (ref 0.0–0.2)

## 2024-12-05 LAB — WET PREP, GENITAL
Clue Cells Wet Prep HPF POC: NONE SEEN
Sperm: NONE SEEN
Trich, Wet Prep: NONE SEEN
WBC, Wet Prep HPF POC: <10
Yeast Wet Prep HPF POC: NONE SEEN

## 2024-12-05 LAB — HCG, QUANTITATIVE, PREGNANCY: hCG, Beta Chain, Quant, S: 836 m[IU]/mL — ABNORMAL HIGH

## 2024-12-05 LAB — POCT PREGNANCY, URINE: Preg Test, Ur: POSITIVE — AB

## 2024-12-05 NOTE — MAU Note (Signed)
 Misty Kelly is a 34 y.o. at [redacted]w[redacted]d here in MAU reporting finding out she was pregnant last wk. Last night with tossing and turning in bed she had some sharp, lower abd pain that subsided. No pain today and no VB. Has hx of SAB, Ectopic, and molar pregnancies so was concerned about the pain last night.   LMP: 10/26/24 Onset of complaint: last night Pain score: 0 Vitals:   12/05/24 1940  BP: 133/78  Pulse: 88  Resp: 17  Temp: 98.1 F (36.7 C)  SpO2: 100%     FHT: na  Lab orders placed from triage: wet prep, gc/chlam, u/a

## 2024-12-05 NOTE — MAU Provider Note (Signed)
 " History     [id number]  Arrival date and time: 12/05/24 1821    Chief Complaint  Patient presents with   Abdominal Pain     HPI Misty Kelly is a 34 y.o. at [redacted]w[redacted]d who presents with no complaints.  She stated yesterday evening she started having lower abdominal pain that lasted less than an hour and then stopped.  She took a pregnancy test at home which was positive.  She has a recent history of ectopic pregnancy in August 2025 and so she was scared that something similar might be happening so she presented to the MAU.  She currently denies any pain, denies vaginal bleeding, discharge, irritation.  She denies cramping.      Past Medical History:  Diagnosis Date   Allergy    STD (sexually transmitted disease) 01/2014   Tx'd for Chlamydia--did have neg. test of cure   Urinary incontinence     Past Surgical History:  Procedure Laterality Date   BREAST BIOPSY     DILATION AND EVACUATION N/A 08/10/2023   Procedure: DILATATION AND EVACUATION;  Surgeon: Eveline Lynwood MATSU, MD;  Location: [facility] OR;  Service: Gynecology;  Laterality: N/A;   OPERATIVE ULTRASOUND N/A 08/10/2023   Procedure: OPERATIVE ULTRASOUND;  Surgeon: Eveline Lynwood MATSU, MD;  Location: [facility] OR;  Service: Gynecology;  Laterality: N/A;   wisdom teeth ext      Family History  Problem Relation Age of Onset   Hyperlipidemia Brother    Hypertension Brother    Cancer Maternal Grandfather        Lung Ca   Stroke Paternal Grandfather    Cancer Maternal Grandmother 69        Dec Esophogeal Ca   Hypertension Mother     Social History   Socioeconomic History   Marital status: Significant Other    Spouse name: Not on file   Number of children: Not on file   Years of education: Not on file   Highest education level: Associate degree: academic program  Occupational History   Not on file  Tobacco Use   Smoking status: Never   Smokeless tobacco: Never  Substance and Sexual Activity   Alcohol use: Yes    Alcohol/week: 0.0  standard drinks of alcohol    Comment: only special occasions   Drug use: No   Sexual activity: Yes    Partners: Male    Birth control/protection: None  Other Topics Concern   Not on file  Social History Narrative   Not on file   Social Drivers of Health   Tobacco Use: Low Risk (08/21/2024)   Received from [facility]   Patient History    Smoking Tobacco Use: Never    Smokeless Tobacco Use: Never    Passive Exposure: Never  Financial Resource Strain: Low Risk (08/18/2024)   Received from [facility]   Overall Financial Resource Strain (CARDIA)    How hard is it for you to pay for the very basics like food, housing, medical care, and heating?: Not hard at all  Food Insecurity: No Food Insecurity (08/18/2024)   Received from [facility]   Epic    Within the past 12 months, you worried that your food would run out before you got the money to buy more.: Never true    Within the past 12 months, the food you bought just didn't last and you didn't have money to get more.: Never true  Transportation Needs: No Transportation Needs (08/18/2024)   Received from  [facility]   Epic    In the past 12 months, has lack of transportation kept you from medical appointments or from getting medications?: No    In the past 12 months, has lack of transportation kept you from meetings, work, or from getting things needed for daily living?: No  Physical Activity: Insufficiently Active (08/18/2024)   Received from [facility]   Exercise Vital Sign    On average, how many days per week do you engage in moderate to strenuous exercise (like a brisk walk)?: 3 days    On average, how many minutes do you engage in exercise at this level?: 10 min  Stress: No Stress Concern Present (08/18/2024)   Received from [facility] - Occupational Stress Questionnaire    Do you feel stress - tense, restless, nervous, or anxious, or unable to sleep at night because your  mind is troubled all the time - these days?: Not at all  Social Connections: Socially Integrated (08/18/2024)   Received from [facility]   Social Network    How would you rate your social network (family, work, friends)?: Good participation with social networks  Intimate Partner Violence: Not At Risk (08/18/2024)   Received from [facility]   HITS    Over the last 12 months how often did your partner physically hurt you?: Never    Over the last 12 months how often did your partner insult you or talk down to you?: Never    Over the last 12 months how often did your partner threaten you with physical harm?: Never    Over the last 12 months how often did your partner scream or curse at you?: Never  Depression (PHQ2-9): Not on file  Alcohol Screen: Low Risk (07/20/2024)   Alcohol Screen    Last Alcohol Screening Score (AUDIT): 1  Housing: Unknown (08/18/2024)   Received from [facility]    In the last 12 months, was there a time when you were not able to pay the mortgage or rent on time?: No    Number of Times Moved in the Last Year: Not on file    At any time in the past 12 months, were you homeless or living in a shelter (including now)?: No  Utilities: Not At Risk (08/18/2024)   Received from [facility]    In the past 12 months has the electric, gas, oil, or water company threatened to shut off services in your home?: No  Health Literacy: Not on file    Allergies[1]  Medications Ordered Prior to Encounter[2]  Pertinent positives and negative per HPI, all others reviewed and negative  Physical Exam   BP 133/78 (BP Location: Right Arm)   Pulse 88   Temp 98.1 F (36.7 C)   Resp 17   Ht 5' 5 (1.651 m)   Wt 86.5 kg   LMP 10/26/2024 Comment: per provider cooper, hcg is negative 09/08/2023  SpO2 100%   BMI 31.72 kg/m   Patient Vitals for the past 24 hrs:  BP Temp Pulse Resp SpO2 Height Weight  12/05/24 1940 133/78 98.1 F (36.7 C) 88 17 100 % 5' 5  (1.651 m) 86.5 kg    Physical Exam Vitals and nursing note reviewed.  Constitutional:      Appearance: She is well-developed.  HENT:     Head: Normocephalic and atraumatic.     Mouth/Throat:     Mouth:  Mucous membranes are moist.  Eyes:     Extraocular Movements: Extraocular movements intact.  Cardiovascular:     Rate and Rhythm: Normal rate and regular rhythm.  Pulmonary:     Effort: Pulmonary effort is normal.  Abdominal:     Palpations: Abdomen is soft.     Tenderness: There is no abdominal tenderness.  Skin:    Capillary Refill: Capillary refill takes less than 2 seconds.  Neurological:     General: No focal deficit present.     Mental Status: She is alert.       Labs Results for orders placed or performed during the hospital encounter of 12/05/24 (from the past 24 hours)  Pregnancy, urine POC     Status: Abnormal   Collection Time: 12/05/24  6:43 PM  Result Value Ref Range   Preg Test, Ur POSITIVE (A) NEGATIVE  Wet prep, genital     Status: None   Collection Time: 12/05/24  8:10 PM   Specimen: PATH Cytology Cervicovaginal Ancillary Only  Result Value Ref Range   Yeast Wet Prep HPF POC NONE SEEN NONE SEEN   Trich, Wet Prep NONE SEEN NONE SEEN   Clue Cells Wet Prep HPF POC NONE SEEN NONE SEEN   WBC, Wet Prep HPF POC <10 <10   Sperm NONE SEEN   CBC     Status: Abnormal   Collection Time: 12/05/24  9:22 PM  Result Value Ref Range   WBC 10.7 (H) 4.0 - 10.5 K/uL   RBC 5.16 (H) 3.87 - 5.11 MIL/uL   Hemoglobin 12.8 12.0 - 15.0 g/dL   HCT 60.7 63.9 - 53.9 %   MCV 76.0 (L) 80.0 - 100.0 fL   MCH 24.8 (L) 26.0 - 34.0 pg   MCHC 32.7 30.0 - 36.0 g/dL   RDW 87.3 88.4 - 84.4 %   Platelets 360 150 - 400 K/uL   nRBC 0.0 0.0 - 0.2 %  hCG, quantitative, pregnancy     Status: Abnormal   Collection Time: 12/05/24  9:22 PM  Result Value Ref Range   hCG, Beta Chain, Quant, S 836 (H) <5 mIU/mL    Imaging No results found.  MAU Course  Procedures  Lab Orders          Wet prep, genital         Urinalysis, Routine w reflex microscopic -Urine, Clean Catch         CBC         hCG, quantitative, pregnancy         Pregnancy, urine POC    No orders of the defined types were placed in this encounter.  Imaging Orders  No imaging studies ordered today    MDM Moderate (Level 3-4)  Assessment and Plan  Pregnancy, location unknown  Hx of ectopic pregnancy  [redacted] weeks gestation of pregnancy    Misty Kelly is a 34 y.o. at [redacted]w[redacted]d who presents with no complaints.  - CBC without evidence of anemia - hCG 836 - Discussed that we could do a transvaginal ultrasound due to her history of recent ectopic but patient declined at this time.  She states that she has a child at home that she has to take care of and could not stay. - Wet prep with no evidence of infectious process  - Stable for discharge home - Recommend follow-up hCG in 48 hours - Discussed following up with OB provider - All questions answered, anticipatory guidance, and detailed ectopic/miscarriage precautions provided.  Koral Thaden L Chalene Treu, MD/MHA 12/05/2024 11:14 PM  Allergies as of 12/05/2024   No Known Allergies      Medication List     STOP taking these medications    ondansetron  4 MG disintegrating tablet Commonly known as: ZOFRAN -ODT           [1] No Known Allergies [2]  No current facility-administered medications on file prior to encounter.   Current Outpatient Medications on File Prior to Encounter  Medication Sig Dispense Refill   ondansetron  (ZOFRAN -ODT) 4 MG disintegrating tablet Take 1 tablet (4 mg total) by mouth every 8 (eight) hours as needed. (Patient not taking: Reported on 12/05/2024) 20 tablet 0   [DISCONTINUED] Ferrous Fumarate  (HEMOCYTE - 106 MG FE) 324 (106 Fe) MG TABS tablet Take 1 tablet (106 mg of iron total) by mouth 2 (two) times daily. (Patient not taking: Reported on 10/07/2018) 60 tablet 3   "

## 2024-12-06 LAB — GC/CHLAMYDIA PROBE AMP (~~LOC~~) NOT AT ARMC
Chlamydia: NEGATIVE
Comment: NEGATIVE
Comment: NORMAL
Neisseria Gonorrhea: NEGATIVE

## 2024-12-08 ENCOUNTER — Inpatient Hospital Stay (HOSPITAL_COMMUNITY)
Admission: AD | Admit: 2024-12-08 | Discharge: 2024-12-09 | Disposition: A | Payer: Self-pay | Attending: Obstetrics and Gynecology | Admitting: Obstetrics and Gynecology

## 2024-12-08 DIAGNOSIS — D259 Leiomyoma of uterus, unspecified: Secondary | ICD-10-CM | POA: Diagnosis not present

## 2024-12-08 DIAGNOSIS — Z3A01 Less than 8 weeks gestation of pregnancy: Secondary | ICD-10-CM | POA: Diagnosis not present

## 2024-12-08 DIAGNOSIS — O3680X0 Pregnancy with inconclusive fetal viability, not applicable or unspecified: Secondary | ICD-10-CM | POA: Insufficient documentation

## 2024-12-08 DIAGNOSIS — Z3490 Encounter for supervision of normal pregnancy, unspecified, unspecified trimester: Secondary | ICD-10-CM

## 2024-12-08 DIAGNOSIS — O3411 Maternal care for benign tumor of corpus uteri, first trimester: Secondary | ICD-10-CM | POA: Insufficient documentation

## 2024-12-08 LAB — HCG, QUANTITATIVE, PREGNANCY: hCG, Beta Chain, Quant, S: 3020 m[IU]/mL — ABNORMAL HIGH

## 2024-12-08 NOTE — MAU Note (Signed)
 Pt says she was on Tuesday for sharp pain on both sides of lower abd .  Pain stopped. Told to retn  today to rept labs.  No pain.  Sometimes cramps  No VB .

## 2024-12-08 NOTE — MAU Provider Note (Signed)
 Chief Complaint:  No chief complaint on file.   HPI   None     Misty Kelly is a 34 y.o. [id number] at [redacted]w[redacted]d who presents to maternity admissions for repeat hCG.  She was evaluated in the MAU on 12/05/2024 for an episode of abdominal pain the prior day.  She took a home pregnancy test which was positive, but had a history of ectopic pregnancy August 2025 and wanted to be evaluated.  hCG 836 at that time, patient could not stay to have ultrasound done.  Patient reports that she does not have any pain currently, still does not have any vaginal bleeding.   Past Medical History:  Diagnosis Date   Allergy    STD (sexually transmitted disease) 01/2014   Tx'd for Chlamydia--did have neg. test of cure   Urinary incontinence    OB History  Gravida Para Term Preterm AB Living  6 1 1  0 4 1  SAB IAB Ectopic Multiple Live Births  2 0 1 0 1    # Outcome Date GA Lbr Len/2nd Weight Sex Type Anes PTL Lv  6 Current           5 Ectopic 07/16/24          4 Molar 08/2023          3 SAB 05/2023     SAB     2 Term 07/14/18 [redacted]w[redacted]d 09:39 / 01:47 3742 g F Vag-Spont EPI  LIV     Birth Comments: wnl  1 SAB 03/2016           Past Surgical History:  Procedure Laterality Date   BREAST BIOPSY     DILATION AND EVACUATION N/A 08/10/2023   Procedure: DILATATION AND EVACUATION;  Surgeon: Eveline Lynwood MATSU, MD;  Location: [facility] OR;  Service: Gynecology;  Laterality: N/A;   OPERATIVE ULTRASOUND N/A 08/10/2023   Procedure: OPERATIVE ULTRASOUND;  Surgeon: Eveline Lynwood MATSU, MD;  Location: [facility] OR;  Service: Gynecology;  Laterality: N/A;   wisdom teeth ext     Family History  Problem Relation Age of Onset   Hyperlipidemia Brother    Hypertension Brother    Cancer Maternal Grandfather        Lung Ca   Stroke Paternal Grandfather    Cancer Maternal Grandmother 45        Dec Esophogeal Ca   Hypertension Mother    Social History[1] Allergies[2] No medications prior to admission.    I have reviewed patient's Past Medical Hx,  Surgical Hx, Family Hx, Social Hx, medications and allergies.   ROS  Pertinent items noted in HPI and remainder of comprehensive ROS otherwise negative.   PHYSICAL EXAM  Patient Vitals for the past 24 hrs:  BP Temp Temp src Pulse Resp Height Weight  12/08/24 2300 115/76 98.6 F (37 C) Oral 90 12 5' 5 (1.651 m) 86.2 kg    *** Constitutional: Well-developed, well-nourished female in no acute distress.  HEENT: atraumatic, normocephalic. Neck has normal ROM. EOM intact. Cardiovascular: normal rate & rhythm, warm and well-perfused Respiratory: normal effort, no problems with respiration noted GI: Abd soft, non-tender, non-distended MSK: Extremities nontender, no edema, normal ROM Skin: warm and dry. Acyanotic, no jaundice or pallor. Neurologic: Alert and oriented x 4. No abnormal coordination. Psychiatric: Normal mood. Speech not slurred, not rapid/pressured. Patient is cooperative.  Labs: No results found for this or any previous visit (from the past 24 hours).  Imaging:  No results found.  MDM & MAU COURSE  MDM: {[id number]:67575}  MAU Course: -Vital signs within normal limits. -Repeat hCG today.  Orders Placed This Encounter  Procedures   hCG, quantitative, pregnancy   No orders of the defined types were placed in this encounter.   ASSESSMENT  No diagnosis found.  PLAN  Discharge home in stable condition with return precautions.  ***     Allergies as of 12/08/2024   No Known Allergies      Medication List    You have not been prescribed any medications.     Joesph DELENA Sear, PA     [1]  Social History Tobacco Use   Smoking status: Never   Smokeless tobacco: Never  Substance Use Topics   Alcohol use: Yes    Alcohol/week: 0.0 standard drinks of alcohol    Comment: only special occasions   Drug use: No  [2] No Known Allergies

## 2024-12-09 ENCOUNTER — Inpatient Hospital Stay (HOSPITAL_COMMUNITY)

## 2024-12-09 ENCOUNTER — Encounter (HOSPITAL_COMMUNITY): Payer: Self-pay | Admitting: Obstetrics and Gynecology

## 2024-12-09 DIAGNOSIS — Z3A01 Less than 8 weeks gestation of pregnancy: Secondary | ICD-10-CM | POA: Diagnosis not present

## 2024-12-09 DIAGNOSIS — O3680X0 Pregnancy with inconclusive fetal viability, not applicable or unspecified: Secondary | ICD-10-CM

## 2024-12-09 DIAGNOSIS — D259 Leiomyoma of uterus, unspecified: Secondary | ICD-10-CM | POA: Insufficient documentation

## 2024-12-09 NOTE — Discharge Instructions (Signed)
 The office will call you to schedule a repeat US  in about 10 days!

## 2024-12-14 ENCOUNTER — Other Ambulatory Visit: Payer: Self-pay | Admitting: *Deleted

## 2024-12-14 DIAGNOSIS — O3680X0 Pregnancy with inconclusive fetal viability, not applicable or unspecified: Secondary | ICD-10-CM

## 2024-12-18 ENCOUNTER — Other Ambulatory Visit: Payer: Self-pay

## 2024-12-18 ENCOUNTER — Ambulatory Visit

## 2024-12-18 ENCOUNTER — Other Ambulatory Visit: Payer: Self-pay | Admitting: Family Medicine

## 2024-12-18 DIAGNOSIS — Z3A01 Less than 8 weeks gestation of pregnancy: Secondary | ICD-10-CM | POA: Diagnosis not present

## 2024-12-18 DIAGNOSIS — O3680X0 Pregnancy with inconclusive fetal viability, not applicable or unspecified: Secondary | ICD-10-CM

## 2024-12-18 DIAGNOSIS — Z3491 Encounter for supervision of normal pregnancy, unspecified, first trimester: Secondary | ICD-10-CM

## 2025-01-01 ENCOUNTER — Other Ambulatory Visit: Payer: Self-pay

## 2025-01-01 ENCOUNTER — Inpatient Hospital Stay (HOSPITAL_COMMUNITY)
Admission: AD | Admit: 2025-01-01 | Discharge: 2025-01-01 | Disposition: A | Discharge status: Home / Self Care | Attending: Family Medicine | Admitting: Family Medicine

## 2025-01-01 DIAGNOSIS — N930 Postcoital and contact bleeding: Secondary | ICD-10-CM | POA: Insufficient documentation

## 2025-01-01 DIAGNOSIS — Z3A08 8 weeks gestation of pregnancy: Secondary | ICD-10-CM | POA: Insufficient documentation

## 2025-01-01 DIAGNOSIS — O26891 Other specified pregnancy related conditions, first trimester: Secondary | ICD-10-CM | POA: Insufficient documentation

## 2025-01-01 DIAGNOSIS — Z711 Person with feared health complaint in whom no diagnosis is made: Secondary | ICD-10-CM | POA: Diagnosis not present

## 2025-01-01 LAB — URINALYSIS, ROUTINE W REFLEX MICROSCOPIC
Bilirubin Urine: NEGATIVE
Glucose, UA: 50 mg/dL — AB
Hgb urine dipstick: NEGATIVE
Ketones, ur: NEGATIVE mg/dL
Leukocytes,Ua: NEGATIVE
Nitrite: NEGATIVE
Protein, ur: NEGATIVE mg/dL
Specific Gravity, Urine: 1.015 (ref 1.005–1.030)
pH: 5 (ref 5.0–8.0)

## 2025-01-01 NOTE — MAU Note (Signed)
 Misty Kelly is a 34 y.o. at [redacted]w[redacted]d here in MAU reporting: she's having very light spotting that began today.  Reports last intercourse was yesterday.  Denies pain.  LMP: 10/26/2024 Onset of complaint: today Pain score: 0 Vitals:   01/01/25 1652  BP: 130/67  Pulse: 82  Resp: 18  Temp: 98.2 F (36.8 C)  SpO2: 100%     FHT: NA  Lab orders placed from triage: US 

## 2025-01-01 NOTE — Discharge Instructions (Signed)
